# Patient Record
Sex: Female | Born: 1980 | Race: White | Hispanic: No | Marital: Married | Smoking: Never smoker
Health system: Southern US, Community
[De-identification: ages and names within clinical notes are randomized; demographics above are authoritative.]

## PROBLEM LIST (undated history)

## (undated) DIAGNOSIS — IMO0002 Reserved for concepts with insufficient information to code with codable children: Secondary | ICD-10-CM

## (undated) DIAGNOSIS — R87629 Unspecified abnormal cytological findings in specimens from vagina: Secondary | ICD-10-CM

## (undated) DIAGNOSIS — F329 Major depressive disorder, single episode, unspecified: Secondary | ICD-10-CM

## (undated) DIAGNOSIS — F32A Depression, unspecified: Secondary | ICD-10-CM

## (undated) DIAGNOSIS — O139 Gestational [pregnancy-induced] hypertension without significant proteinuria, unspecified trimester: Secondary | ICD-10-CM

## (undated) DIAGNOSIS — K589 Irritable bowel syndrome without diarrhea: Secondary | ICD-10-CM

## (undated) DIAGNOSIS — Z8669 Personal history of other diseases of the nervous system and sense organs: Secondary | ICD-10-CM

## (undated) DIAGNOSIS — I10 Essential (primary) hypertension: Secondary | ICD-10-CM

## (undated) DIAGNOSIS — R87619 Unspecified abnormal cytological findings in specimens from cervix uteri: Secondary | ICD-10-CM

## (undated) HISTORY — DX: Reserved for concepts with insufficient information to code with codable children: IMO0002

## (undated) HISTORY — DX: Gestational (pregnancy-induced) hypertension without significant proteinuria, unspecified trimester: O13.9

## (undated) HISTORY — PX: COLPOSCOPY: SHX161

## (undated) HISTORY — PX: WISDOM TOOTH EXTRACTION: SHX21

## (undated) HISTORY — DX: Unspecified abnormal cytological findings in specimens from cervix uteri: R87.619

## (undated) HISTORY — DX: Essential (primary) hypertension: I10

## (undated) HISTORY — DX: Unspecified abnormal cytological findings in specimens from vagina: R87.629

## (undated) HISTORY — DX: Personal history of other diseases of the nervous system and sense organs: Z86.69

## (undated) HISTORY — DX: Irritable bowel syndrome, unspecified: K58.9

## (undated) HISTORY — PX: CHOLECYSTECTOMY: SHX55

---
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
## 2001-09-11 ENCOUNTER — Encounter: Payer: Self-pay | Admitting: Emergency Medicine

## 2001-09-11 ENCOUNTER — Emergency Department (HOSPITAL_COMMUNITY): Admission: EM | Admit: 2001-09-11 | Discharge: 2001-09-11 | Payer: Self-pay | Admitting: Emergency Medicine

## 2001-09-12 ENCOUNTER — Ambulatory Visit (HOSPITAL_COMMUNITY): Admission: RE | Admit: 2001-09-12 | Discharge: 2001-09-12 | Payer: Self-pay | Admitting: Family Medicine

## 2001-09-12 ENCOUNTER — Encounter: Payer: Self-pay | Admitting: Family Medicine

## 2001-09-24 ENCOUNTER — Encounter: Payer: Self-pay | Admitting: Gastroenterology

## 2001-09-24 ENCOUNTER — Encounter: Admission: RE | Admit: 2001-09-24 | Discharge: 2001-09-24 | Payer: Self-pay | Admitting: Gastroenterology

## 2001-09-28 ENCOUNTER — Encounter: Payer: Self-pay | Admitting: Gastroenterology

## 2001-09-28 ENCOUNTER — Encounter: Admission: RE | Admit: 2001-09-28 | Discharge: 2001-09-28 | Payer: Self-pay | Admitting: Gastroenterology

## 2001-10-28 ENCOUNTER — Emergency Department (HOSPITAL_COMMUNITY): Admission: EM | Admit: 2001-10-28 | Discharge: 2001-10-29 | Payer: Self-pay | Admitting: Emergency Medicine

## 2001-10-28 ENCOUNTER — Encounter: Payer: Self-pay | Admitting: Emergency Medicine

## 2002-06-11 ENCOUNTER — Encounter: Admission: RE | Admit: 2002-06-11 | Discharge: 2002-08-06 | Payer: Self-pay | Admitting: Sports Medicine

## 2002-10-01 ENCOUNTER — Encounter: Payer: Self-pay | Admitting: Emergency Medicine

## 2002-10-01 ENCOUNTER — Emergency Department (HOSPITAL_COMMUNITY): Admission: EM | Admit: 2002-10-01 | Discharge: 2002-10-02 | Payer: Self-pay | Admitting: Emergency Medicine

## 2002-10-13 ENCOUNTER — Encounter: Admission: RE | Admit: 2002-10-13 | Discharge: 2002-10-13 | Payer: Self-pay | Admitting: Emergency Medicine

## 2002-10-13 ENCOUNTER — Encounter: Payer: Self-pay | Admitting: Emergency Medicine

## 2002-11-29 ENCOUNTER — Other Ambulatory Visit: Admission: RE | Admit: 2002-11-29 | Discharge: 2002-11-29 | Payer: Self-pay | Admitting: Obstetrics and Gynecology

## 2003-01-04 ENCOUNTER — Ambulatory Visit (HOSPITAL_COMMUNITY): Admission: RE | Admit: 2003-01-04 | Discharge: 2003-01-04 | Payer: Self-pay | Admitting: Orthopaedic Surgery

## 2003-08-06 HISTORY — PX: OTHER SURGICAL HISTORY: SHX169

## 2004-06-01 ENCOUNTER — Other Ambulatory Visit: Admission: RE | Admit: 2004-06-01 | Discharge: 2004-06-01 | Payer: Self-pay | Admitting: Obstetrics and Gynecology

## 2004-09-27 ENCOUNTER — Emergency Department (HOSPITAL_COMMUNITY): Admission: EM | Admit: 2004-09-27 | Discharge: 2004-09-27 | Payer: Self-pay | Admitting: Emergency Medicine

## 2004-10-01 ENCOUNTER — Ambulatory Visit (HOSPITAL_COMMUNITY): Admission: RE | Admit: 2004-10-01 | Discharge: 2004-10-01 | Payer: Self-pay | Admitting: Family Medicine

## 2004-10-14 ENCOUNTER — Emergency Department (HOSPITAL_COMMUNITY): Admission: EM | Admit: 2004-10-14 | Discharge: 2004-10-14 | Payer: Self-pay | Admitting: Emergency Medicine

## 2004-10-14 ENCOUNTER — Emergency Department (HOSPITAL_COMMUNITY): Admission: EM | Admit: 2004-10-14 | Discharge: 2004-10-14 | Payer: Self-pay | Admitting: Family Medicine

## 2004-10-14 ENCOUNTER — Emergency Department (HOSPITAL_COMMUNITY): Admission: EM | Admit: 2004-10-14 | Discharge: 2004-10-15 | Payer: Self-pay | Admitting: Emergency Medicine

## 2004-11-02 ENCOUNTER — Encounter: Admission: RE | Admit: 2004-11-02 | Discharge: 2004-11-02 | Payer: Self-pay | Admitting: Gastroenterology

## 2004-11-14 ENCOUNTER — Emergency Department (HOSPITAL_COMMUNITY): Admission: EM | Admit: 2004-11-14 | Discharge: 2004-11-15 | Payer: Self-pay | Admitting: Emergency Medicine

## 2004-11-17 ENCOUNTER — Emergency Department (HOSPITAL_COMMUNITY): Admission: EM | Admit: 2004-11-17 | Discharge: 2004-11-17 | Payer: Self-pay | Admitting: Emergency Medicine

## 2004-11-19 ENCOUNTER — Ambulatory Visit (HOSPITAL_COMMUNITY): Admission: RE | Admit: 2004-11-19 | Discharge: 2004-11-19 | Payer: Self-pay | Admitting: Gastroenterology

## 2004-11-19 ENCOUNTER — Encounter (INDEPENDENT_AMBULATORY_CARE_PROVIDER_SITE_OTHER): Payer: Self-pay | Admitting: *Deleted

## 2004-11-21 ENCOUNTER — Inpatient Hospital Stay (HOSPITAL_COMMUNITY): Admission: EM | Admit: 2004-11-21 | Discharge: 2004-11-28 | Payer: Self-pay | Admitting: Emergency Medicine

## 2004-11-29 ENCOUNTER — Inpatient Hospital Stay (HOSPITAL_COMMUNITY): Admission: AD | Admit: 2004-11-29 | Discharge: 2004-12-01 | Payer: Self-pay | Admitting: Gastroenterology

## 2004-12-07 ENCOUNTER — Ambulatory Visit (HOSPITAL_COMMUNITY): Admission: RE | Admit: 2004-12-07 | Discharge: 2004-12-08 | Payer: Self-pay | Admitting: Surgery

## 2004-12-07 ENCOUNTER — Encounter (INDEPENDENT_AMBULATORY_CARE_PROVIDER_SITE_OTHER): Payer: Self-pay | Admitting: *Deleted

## 2005-06-04 ENCOUNTER — Other Ambulatory Visit: Admission: RE | Admit: 2005-06-04 | Discharge: 2005-06-04 | Payer: Self-pay | Admitting: Obstetrics and Gynecology

## 2005-11-18 ENCOUNTER — Encounter: Admission: RE | Admit: 2005-11-18 | Discharge: 2005-11-18 | Payer: Self-pay | Admitting: Gastroenterology

## 2005-11-20 ENCOUNTER — Encounter: Admission: RE | Admit: 2005-11-20 | Discharge: 2005-11-20 | Payer: Self-pay | Admitting: Gastroenterology

## 2006-06-05 ENCOUNTER — Other Ambulatory Visit: Admission: RE | Admit: 2006-06-05 | Discharge: 2006-06-05 | Payer: Self-pay | Admitting: Obstetrics and Gynecology

## 2007-12-20 ENCOUNTER — Ambulatory Visit (HOSPITAL_COMMUNITY): Admission: RE | Admit: 2007-12-20 | Discharge: 2007-12-20 | Payer: Self-pay

## 2008-01-23 ENCOUNTER — Emergency Department (HOSPITAL_COMMUNITY): Admission: EM | Admit: 2008-01-23 | Discharge: 2008-01-23 | Payer: Self-pay | Admitting: Emergency Medicine

## 2008-02-04 ENCOUNTER — Emergency Department (HOSPITAL_COMMUNITY): Admission: EM | Admit: 2008-02-04 | Discharge: 2008-02-04 | Payer: Self-pay | Admitting: Emergency Medicine

## 2008-12-19 ENCOUNTER — Emergency Department (HOSPITAL_COMMUNITY): Admission: EM | Admit: 2008-12-19 | Discharge: 2008-12-20 | Payer: Self-pay | Admitting: Emergency Medicine

## 2009-03-19 ENCOUNTER — Emergency Department (HOSPITAL_COMMUNITY): Admission: EM | Admit: 2009-03-19 | Discharge: 2009-03-19 | Payer: Self-pay | Admitting: Emergency Medicine

## 2009-03-24 ENCOUNTER — Ambulatory Visit (HOSPITAL_COMMUNITY): Admission: RE | Admit: 2009-03-24 | Discharge: 2009-03-24 | Payer: Self-pay | Admitting: Urology

## 2010-11-10 LAB — URINE CULTURE
Colony Count: NO GROWTH
Culture: NO GROWTH

## 2010-11-10 LAB — DIFFERENTIAL
Basophils Absolute: 0.1 10*3/uL (ref 0.0–0.1)
Basophils Relative: 1 % (ref 0–1)
Eosinophils Absolute: 0.2 10*3/uL (ref 0.0–0.7)
Eosinophils Relative: 3 % (ref 0–5)
Lymphocytes Relative: 22 % (ref 12–46)
Lymphs Abs: 2.1 10*3/uL (ref 0.7–4.0)
Monocytes Absolute: 0.4 10*3/uL (ref 0.1–1.0)
Monocytes Relative: 4 % (ref 3–12)
Neutro Abs: 6.9 10*3/uL (ref 1.7–7.7)
Neutrophils Relative %: 71 % (ref 43–77)

## 2010-11-10 LAB — POCT I-STAT, CHEM 8
BUN: 9 mg/dL (ref 6–23)
Calcium, Ion: 1.15 mmol/L (ref 1.12–1.32)
Chloride: 107 mEq/L (ref 96–112)
Creatinine, Ser: 0.7 mg/dL (ref 0.4–1.2)
Glucose, Bld: 91 mg/dL (ref 70–99)
HCT: 42 % (ref 36.0–46.0)
Hemoglobin: 14.3 g/dL (ref 12.0–15.0)
Potassium: 3.6 mEq/L (ref 3.5–5.1)
Sodium: 143 mEq/L (ref 135–145)
TCO2: 22 mmol/L (ref 0–100)

## 2010-11-10 LAB — URINALYSIS, ROUTINE W REFLEX MICROSCOPIC
Bilirubin Urine: NEGATIVE
Glucose, UA: NEGATIVE mg/dL
Hgb urine dipstick: NEGATIVE
Ketones, ur: NEGATIVE mg/dL
Nitrite: NEGATIVE
Protein, ur: NEGATIVE mg/dL
Specific Gravity, Urine: 1.016 (ref 1.005–1.030)
Urobilinogen, UA: 0.2 mg/dL (ref 0.0–1.0)
pH: 5.5 (ref 5.0–8.0)

## 2010-11-10 LAB — CBC
HCT: 40.9 % (ref 36.0–46.0)
Hemoglobin: 14.2 g/dL (ref 12.0–15.0)
MCHC: 34.7 g/dL (ref 30.0–36.0)
MCV: 88 fL (ref 78.0–100.0)
Platelets: 197 10*3/uL (ref 150–400)
RBC: 4.65 MIL/uL (ref 3.87–5.11)
RDW: 11.9 % (ref 11.5–15.5)
WBC: 9.7 10*3/uL (ref 4.0–10.5)

## 2010-11-13 LAB — CBC
HCT: 39.1 % (ref 36.0–46.0)
Hemoglobin: 13.6 g/dL (ref 12.0–15.0)
MCHC: 34.7 g/dL (ref 30.0–36.0)
MCV: 88.8 fL (ref 78.0–100.0)
Platelets: 176 10*3/uL (ref 150–400)
RBC: 4.4 MIL/uL (ref 3.87–5.11)
RDW: 12.1 % (ref 11.5–15.5)
WBC: 10.8 10*3/uL — ABNORMAL HIGH (ref 4.0–10.5)

## 2010-11-13 LAB — WET PREP, GENITAL
Clue Cells Wet Prep HPF POC: NONE SEEN
Trich, Wet Prep: NONE SEEN
Yeast Wet Prep HPF POC: NONE SEEN

## 2010-11-13 LAB — URINE CULTURE: Colony Count: >=100000

## 2010-11-13 LAB — GC/CHLAMYDIA PROBE AMP, GENITAL
Chlamydia, DNA Probe: NEGATIVE
GC Probe Amp, Genital: NEGATIVE

## 2010-11-13 LAB — COMPREHENSIVE METABOLIC PANEL
ALT: 12 U/L (ref 0–35)
AST: 14 U/L (ref 0–37)
Albumin: 3.4 g/dL — ABNORMAL LOW (ref 3.5–5.2)
Alkaline Phosphatase: 50 U/L (ref 39–117)
BUN: 10 mg/dL (ref 6–23)
CO2: 23 mEq/L (ref 19–32)
Calcium: 8.8 mg/dL (ref 8.4–10.5)
Chloride: 106 mEq/L (ref 96–112)
Creatinine, Ser: 0.67 mg/dL (ref 0.4–1.2)
GFR calc Af Amer: >60 mL/min (ref >60)
GFR calc non Af Amer: >60 mL/min (ref >60)
Glucose, Bld: 93 mg/dL (ref 70–99)
Potassium: 3.7 mEq/L (ref 3.5–5.1)
Sodium: 137 mEq/L (ref 135–145)
Total Bilirubin: 0.4 mg/dL (ref 0.3–1.2)
Total Protein: 6.5 g/dL (ref 6.0–8.3)

## 2010-11-13 LAB — URINALYSIS, ROUTINE W REFLEX MICROSCOPIC
Glucose, UA: NEGATIVE mg/dL
Ketones, ur: 15 mg/dL — AB
Nitrite: NEGATIVE
Protein, ur: NEGATIVE mg/dL
Specific Gravity, Urine: 1.018 (ref 1.005–1.030)
Urobilinogen, UA: 1 mg/dL (ref 0.0–1.0)
pH: 7 (ref 5.0–8.0)

## 2010-11-13 LAB — URINE MICROSCOPIC-ADD ON

## 2010-11-13 LAB — DIFFERENTIAL
Basophils Absolute: 0.1 10*3/uL (ref 0.0–0.1)
Basophils Relative: 1 % (ref 0–1)
Eosinophils Absolute: 0.3 10*3/uL (ref 0.0–0.7)
Eosinophils Relative: 2 % (ref 0–5)
Lymphocytes Relative: 24 % (ref 12–46)
Lymphs Abs: 2.6 10*3/uL (ref 0.7–4.0)
Monocytes Absolute: 0.7 10*3/uL (ref 0.1–1.0)
Monocytes Relative: 6 % (ref 3–12)
Neutro Abs: 7.2 10*3/uL (ref 1.7–7.7)
Neutrophils Relative %: 67 % (ref 43–77)

## 2010-11-13 LAB — POCT PREGNANCY, URINE: Preg Test, Ur: NEGATIVE

## 2010-12-21 NOTE — H&P (Signed)
NAMETUWANA, KAPAUN NO.:  [id number]   MEDICAL RECORD NO.:  [id number]          PATIENT TYPE:  INP   LOCATION:  0376                         FACILITY:  [facility]   PHYSICIAN:  James L. Malon Kindle., M.D.DATE OF BIRTH:  25-Aug-1980   DATE OF ADMISSION:  11/30/2004  DATE OF DISCHARGE:                                HISTORY & PHYSICAL   REASON FOR ADMISSION:  Nausea, vomiting and abdominal pain.   HISTORY:  This is a readmission for this 30 year old white female who was  seen the first time in our practice on March 27. She had had a long history  of irritable bowel. She had been ill for 3-4 weeks, started having nausea,  vomiting, diarrhea and abdominal pain. She had a CT scan of the abdomen that  was negative, negative urine pregnancy, urinalysis, etc. She had multiple ER  visits. She has had gallbladder ultrasounds. When I saw her she was having  quite a bit of diarrhea, we did a colonoscopy with biopsy of her colon and  this was normal. The patient was quite agitated during the procedure and did  not tolerate it particularly well.  Multiple other tests have included CT  scans, ultrasounds, gallbladder ejection fraction with CCK stimulation  showing slight diminishment at 30 minutes of only 6% but 39% at an hour.  Negative chest x-ray. There was some question on the acute abdominal series  of a spot on the lung but this did not show up on PA and lateral chest. No  abnormalities were seen on ultrasound. She did have very mild delayed  gastric emptying with 51% at 60 minutes and 48% under 20 minutes. This was  diminished and it was felt to be due to the narcotics she was taking. She  was in [facility] and was sent home yesterday and was seen by my  partner Dr. Madilyn Fireman. Apparently the patient was eating and wanted to go home.  She had been seen in consultation by Dr. Ezzard Standing, seen Dr. Daphine Deutscher and Dr.  Zachery Dakins as well as Dr. Ewing Schlein over the weekend. Everyone has  told her that  all the tests performed on her including an outpatient upper GI and small  bowel followthrough have all been normal and that we still could not be  absolutely certain that this was not her gallbladder although the odds of  her getting better would be much improved with a positive test. She seemed  to accept this and was feeling a bit better yesterday and went home. She  apparently ate [place] food last night, became sick, started having upper  abdominal pain and vomiting every since. She came into the office  unannounced vomiting in the waiting room and exam room and we put her back  in a room and are seeing her urgently now.   CURRENT MEDICATIONS:  Lexapro, Xanax, Nexium, birth control pills,  multivitamins, Carafate, Phenergan, Reglan, Vicodin.   ALLERGIES:  She is allergic to SULFA and IVP DYE.   PAST MEDICAL HISTORY:  History of asthma in the distant past, has had  chronic irritable bowel syndrome, acne.   PAST  SURGICAL HISTORY:  Her only surgery has been removal of her wisdom  teeth under general anesthesia.   FAMILY HISTORY:  Mother is 55 and has endometriosis. Father has bipolar  disease, two siblings have ADHD. There is no family history of inflammatory  bowel disease or colon cancer although there might have been colon polyps in  a grandparent.   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  Drinks occasionally, does not really travel much. Her  main social concern at this point seems to be graduating on time.   REVIEW OF SYMPTOMS:  Primarily as mentioned above. She has lost 20 pounds  over the past several weeks due to nausea, vomiting and abdominal pain.   PHYSICAL EXAMINATION:  VITAL SIGNS:  The patient is afebrile, temperature  98.4, pulse 84, blood pressure 110/68.  GENERAL:  An obese, crying, white female who is retching into a trash can  clear vomitus.  HEENT:  Eyes sclera nonicteric, extraocular  movements intact. Throat, mucous  membranes are dry.  LUNGS:  Clear.  HEART:  Regular rate and rhythm without murmurs or gallops.  ABDOMEN:  Mild epigastric tenderness with a few bowel sounds.   ASSESSMENT:  Nausea and vomiting of unclear cause. I am not certain that  this is due to her gallbladder but at this point she has had an extensive  workup that has failed to show any other etiology. Clearly with her vomiting  we need to get this under control and have her reseen by the surgeons to see  if we need to reconsider cholecystectomy. I think that we need to repeat a  hepatobiliary scan as well. This has been the only test to date that has  been abnormal.      JLE/MEDQ  D:  11/29/2004  T:  11/29/2004  Job:  [id number]   cc:   Stacie Acres. White, M.D.  [address]  [place]  [place] [address]  Fax: [phone]   Sandria Bales. Ezzard Standing, M.D.  [address]  [place]  [place] [address]

## 2010-12-21 NOTE — Op Note (Signed)
[name] NO.:  [id number]   MEDICAL RECORD NO.:  [id number]          PATIENT TYPE:  OIB   LOCATION:  2550                         FACILITY:  [facility]   PHYSICIAN:  Sandria Bales. Ezzard Standing, M.D.  DATE OF BIRTH:  July 06, 1981   DATE OF PROCEDURE:  12/07/2004  DATE OF DISCHARGE:                                 OPERATIVE REPORT   PREOPERATIVE DIAGNOSIS:  Right upper quadrant pain, possible biliary pain.   POSTOPERATIVE DIAGNOSIS:  Right upper quadrant pain, possible biliary pain.   PROCEDURE:  Laparoscopic cholecystectomy with intraoperative cholangiogram.   SURGEON:  Sandria Bales. Ezzard Standing, M.D.   ASSISTANT:  No first assistant.   ANESTHESIA:  General.   BLOOD LOSS:  Minimal.   INDICATIONS FOR PROCEDURE:  [name] is a 30 year old, mildly obese,  white female who has had about an eight-week history of nausea, abdominal  pain and on-and-off loose stools.  She has seen Dr. Claria Dice and been  through multiple evaluations which include CT scans, ultrasounds,  hepatobiliary scans, each repeated at least once.  She had a colonoscopy  done and had a gastric emptying scan.  The gastric emptying scan showed some  delay in gastric emptying, but the other test have been basically negative.   She has continued to have vague epigastric pain which  radiates to the right  upper quadrant.  It sometimes seems to be associated with food.  It is  unclear whether this is gallbladder/biliary pain or some type of irritable  bowel syndrome.  We discussed with the patient about the options for further  treatment, which include further medical management of her symptoms, furthr  tests to evaluate her pain, versus laparoscopic surgery to evaluate her  peritoneal cavity and perform a cholecystectomy.  She was interested in  proceeding with gallbladder surgery.  At the same time, a laparoscopic  evaluation can be done.   The indications and potential complications were explained to the  patient  multiple times.  In fact, her original surgery was planned by Dr. Zachery Dakins.  The potential complications were explained and include, but are not limited  to, bleeding, infection, bowel injury, bile duct injury, the possibility of  open surgery, and possibility of finding some other cause for her pain at  the time of surgery.  She also understands there is at least a 50% chance  that the surgery will not help her symptoms.   The patient agreed to proceed with laparoscopic cholecystectomy and  laparoscopy for evaluation.   DESCRIPTION OF PROCEDURE:  The patient was placed in a supine position and  given a general endotracheal anesthetic.  She had 1 g of Ancef initiation of  the procedure.  Her abdomen was prepped with Betadine solution and sterilely  draped.   Four abdominal trocars were placed.  A 12-mm Hasson trocar introduced  through an Hasson method at the umbilicus, then a 10-mm subxiphoid trocar, 5-  mm right subcostal, 5-mm lateral subcostal trocar.   The 10-mm, zero-degree laparoscope was inserted into the abdominal cavity.  The right and left lobes of the liver were unremarkable.  The anterior wall  of the stomach was unremarkable.  The bowel, that which I could see, was  unremarkable.  I did pay particular attention to the right colon, especially  to see if there was an inflammatory mass, lesion or nodularity.  There was  nothing that I could see that was abnormal, specifically, there was no  abnormal fluid, no nodularity, no suspicious mass lesion or nodule or  anything to explain her abdominal pain.   The gallbladder was visualized.  It may be slightly whiter than normal but  did not have any evidence or stigmata of cholecystitis.   I identified the gallbladder, grasped the gall bladder and rotated the  gallbladder cephalad and cut down and identified the cystic artery which was  doubly endo-clipped and divided. The cystic duct was identified and a clip  was  placed on the gallbladder side of the cystic duct.   An intraoperative cholangiogram was obtained with the cut-off, taut catheter  inserted through a 14-gauge Jelco in the right upper quadrant, into the  cystic duct and this was secured with an endoclip.   The patient used about 4-5 mL of half-strength Renografin for contrast,  which showed free flow down the cystic duct, down the common bile duct, into  the duodenum and reflux up the hepatic radicles without any abnormality or  filling defect.  This was felt to be a normal intraoperative cholangiogram.   Note, that she did say she had been allergic to IV dye, had a rash about  five years ago, but we had no trouble at the time of surgery after the  administration of this contrast.   The gallbladder and cystic duct were then triply endoclipped and divided.  The gallbladder was then sharply and bluntly dissected from the gallbladder  wall using primary hook dissection.  The patient had one bleeder in the  gallbladder bed that required Bovie electrocautery.  I did leave the piece  of Surgicel in the gallbladder bed.   I then irrigated the abdomen out with about 1.5 liters of saline.  I  delivered the gallbladder through the umbilicus in an EndoCatch bag and sent  it to pathology.  I did not feel any obvious or palpable stone in the gall  bladderat the time of surgery.   Each trocar was removed in turn.  There was no bleeding at any trocar site  and umbilical port was closed with 0 Vicryl suture.  The skin at each site  was closed with a 5-0 Vicryl suture.  The patient was painted with Benzoin  and Steri-Strips.  The patient tolerated the procedure well and was  transported to recovery room in good condition.   Sponge and needle counts were correct at the end of the case.      DHN/MEDQ  D:  12/07/2004  T:  12/07/2004  Job:  [id number]   cc:   Stacie Acres. White, M.D. [address]  [place]  [place] [address]  Fax: [phone]    Llana Aliment. Malon Kindle., M.D.  [address]  [place]  [place] [address]  Fax: [phone]

## 2010-12-21 NOTE — H&P (Signed)
[name], DUNFORD NO.:  [id number]   MEDICAL RECORD NO.:  [id number]          PATIENT TYPE:  INP   LOCATION:  0104                         FACILITY:  [facility]   PHYSICIAN:  Jackie Plum, M.D.DATE OF BIRTH:  [birth year]   DATE OF ADMISSION:  11/21/2004  DATE OF DISCHARGE:                                HISTORY & PHYSICAL   CHIEF COMPLAINT:  Abdominal pain, nausea and vomiting.   HISTORY OF PRESENT ILLNESS:  Patient is a 30 year old Caucasian female with  a history of irritable bowel syndrome, who presented with the above  complaint.  According to the patient, she has been having abdominal pain for  about two months now and has had workup, mainly by Dr. Randa Evens of [facility].  Her last evaluation was on a Monday, November 19, 2004, at which time she had a  colonoscopy.  She does not know the result of the colonoscopy at this point.  She indicates that after the colonoscopy, she had severe pain, and Dr.  Randa Evens called in a medicine to her the next day, Tuesday, November 20, 2004,  yesterday, which relieved her pain.  She came to the ER because her pain  recurred.  She had visited the ER about six times because of this abdominal  pain over the last month or so.  The pain is mainly in the right upper  quadrant area and epigastric area as well as the lower extremity area.  The  pain is mainly in the right upper quadrant and epigastric area.  She has had  some pain involving the left lower quadrant and right upper quadrant  previously, but she does not have this pain right now.  The pain is  described as crampy in nature with moderate severity without any known  aggravating or alleviating factor except for pain meds.  She vomited twice  today.  She denies any constipation or diarrhea.  She denies any fevers or  chills, headache, cough, chest pain, shortness of breath.  In the emergency  room, the patient had some Dilaudid.  The patient came to the emergency room  today  because she called Dr. Randa Evens office and was asked to come to the  emergency room for evaluation and possible admission.  She was under the  impression that she would be admitted to the GI service.   PAST MEDICAL HISTORY:  History of recurrent abdominal pain.  Previous  studies of irritable bowel syndrome.  History of depression.   ALLERGIES:  Patient is allergic to IVP DYE, which causes rash, and MYCINS,  SULFA medications.   Her current medicines including Lexapro, Zyrtec, multivitamins, birth  control pills, and Nexium as well as Carafate.   SOCIAL HISTORY:  The patient does not drink alcohol nor use illicit drugs  but she smoke cigarettes.   PHYSICAL EXAMINATION:  VITAL SIGNS:  BP 111/62, pulse 70, respirations 20,  temp 98.3.  O2 saturation of 98% on room air.  GENERAL:  She was in moderate painful distress.  Not in acute  cardiopulmonary distress.  HEENT:  Normocephalic and atraumatic.  Pupils are equal, round and reactive  to  light.  Extraocular movements are intact.  NECK:  Supple.  No JVD.  LUNGS:  Clear to auscultation bilaterally.  ABDOMEN:  Full.  Soft.  She had tenderness involving the right upper  quadrant and epigastric area.  She also has some mild tenderness in the  lower abdominal region without any rebound tenderness.  CARDIAC:  Regular rate and rhythm.  No murmurs or gallops.  EXTREMITIES:  No cyanosis or edema.  NEUROLOGIC:  Nonfocal.   LAB WORK:  Abdominal ultrasound has been ordered, results pending.   UA:  Color was amber appearance.  Cloudy.  Specific gravity 1.027, pH 5.5,  glucose negative, hemoglobin negative, bilirubin small, ketones trace,  protein negative.  Urobilinogen 0.2, nitrite negative, leukocyte esterase  negative.  Urine pregnancy test was negative.  WBC 9.5, hemoglobin 14.3,  hematocrit 41.1, MCV 85.1, platelet count 195.  Sodium 138, potassium 3.7,  chloride 109, CO2 27, glucose 83, BUN 10, creatinine 0.8, calcium 9.8,  protein 7.2,  albumin 3.8, AST 24, ALT 21, alkaline phosphatase 41. Bilirubin  0.4.   IMPRESSION:  Acute-on-chronic recurrent abdominal pain.   PLAN:  Admit the patient to medical service for pain control.  We do not  have access to the patient's previous GI workup at this point.  GI medicine  has been consulted.  I called Dr Reece Agar on call for Dr. Randa Evens this  evening, and discussed the case with him.  I have asked him to see the  patient tonight in the ED.  We will follow up the ultrasound report and  follow further work up per GI Medicine.      GO/MEDQ  D:  11/21/2004  T:  11/21/2004  Job:  [id number]

## 2010-12-21 NOTE — Op Note (Signed)
[name], LOBB          ACCOUNT NO.:  [id number]   MEDICAL RECORD NO.:  [id number]          PATIENT TYPE:  AMB   LOCATION:  ENDO                         FACILITY:  [facility]   PHYSICIAN:  James L. Malon Kindle., M.D.DATE OF BIRTH:  1981-06-26   DATE OF PROCEDURE:  11/19/2004  DATE OF DISCHARGE:                                 OPERATIVE REPORT   PROCEDURE:  Colonoscopy and biopsy.   MEDICATIONS:  1.  Phenergan 25 mg.  2.  Fentanyl 100 mcg.  3.  Versed 10 mg IV.   SCOPE:  Olympus pediatric adjustable colonoscope.   INDICATION:  A 30 year old with a long history of irritable bowel, whom I  saw in the office with diarrhea, nausea, vomiting, and 20 pound weight loss.  Her stools were positive for blood.  She was having profuse diarrhea.  Ultrasound showed fatty liver.  She had been having up to 12 bowel movements  a day.  The concern was that she may have inflammatory bowel disease and for  this reason, colonoscopy was recommended.  She was seen in the emergency  room over the weekend.  Labs were normal.  This time with upper abdominal  pain.  We discussed with her at the unit and obtained permission to do an  upper endoscopy as well.   DESCRIPTION OF PROCEDURE:  The procedure explained to the patient, consent  obtained.  The left lateral decubitus position, digital exam was performed,  and the Olympus pediatric adjustable colonoscope was inserted.  The prep was  excellent.  We advanced the scope and despite an enormous amount of  medication, the patient was belligerent and violent throughout the procedure  with immediate sedation as soon as the scope advancement was stopped.  We  were eventually able to reach the cecum using no abdominal pressure, and we  were actually able to arrive fairly easily and as soon as the cecum was  reached, the cecum and ileocecal valve were photographed and the scope  withdrawn.  The patient promptly fell asleep and no longer had to be  restrained.  Multiple biopsies were taken throughout.  There was normal  mucosa throughout with no ulceration, no polyps, no tumors, etc., no  diverticular disease.  All the biopsies were placed in a single jar labeled  random colon, rule out colitis.  There were small, if any, internal  hemorrhoids in the rectum upon removal of the scope.  The scope was  withdrawn.  The patient tolerated the procedure reasonably well after the  scope had reached the cecum and was sleeping peacefully at the termination  of the procedure.   ASSESSMENT:  1.  Heme-positive stool.  72.1  2.  Diarrhea, cannot rule out microscopic colitis.  787.91.  3.  Mucosa normal on colonoscopy, grossly.   PLAN:  We will check path results.  We will arrange an upper GI and small  bowel series to rule out ulcer or Crohn's disease.  We will obtain records  from her emergency room visit and return to the office in 2-3 weeks.      JLE/MEDQ  D:  11/19/2004  T:  11/19/2004  Job:  [id number]   cc:   Stacie Acres. White, M.D.  [address]  [place]  [place] [address]  Fax: [phone]   Thornton Park. Daphine Deutscher, MD  [address]  [place]  [place] [address]

## 2010-12-21 NOTE — Discharge Summary (Signed)
[name], [name] NO.:  [id number]   MEDICAL RECORD NO.:  [id number]          PATIENT TYPE:  INP   LOCATION:  0351                         [facility]ACILITY:  [facility]   PHYSICIAN:  John C. Madilyn Fireman, M.D.    DATE OF BIRTH:  03-14-1981   DATE OF ADMISSION:  11/21/2004  DATE OF DISCHARGE:  11/28/2004                                 DISCHARGE SUMMARY   HISTORY OF PRESENT ILLNESS:  The patient is a 30 year old white female,  admitted with a 51-month history of right-sided abdominal pain, nausea,  vomiting and loose stools.  Prior to admission, she had undergone an  abdominal CT scan, upper GI with small bowel series, colonoscopy and  abdominal ultrasound.  These were all unrevealing except for suggestion of a  thickened gallbladder with follow-up hepatobiliary scan normal.  For  details, please see admission history and physical.   HOSPITAL COURSE:  The patient was started on IV fluids, and her pain and  nausea were treated symptomatically.  She had a 24-hour urine, porphyria  screen, serum gastrin and tissue transglutaminase, which were all negative.  Based on the previous ultrasound, the ultrasound was repeated and was  normal.  Nonetheless, Dr. Ezzard Standing was consulted for surgical standpoint  regarding possible gallbladder source of her pain.  He did not think that  there was any objective evidence tying her symptoms to her gallbladder.  She  did undergo a nuclear medicine gastric emptying study which showed some  delayed gastric emptying.  It was considered to start her on Reglan, but the  patient was taking a lot of Dilaudid, Phenergan and Zofran, which were  causing her to be quite sedated and lethargic, and I did not want to start  her on any other psychotropic medications until she had become weaned off of  some of these medicines.  The patient demonstrated some episodes of  dissatisfaction with the nursing staff which are documented in the chart.  Some of this was related  to difficulty with IV access.  The patient  continued to complain of pain throughout the majority of her  hospitalization.  A chest x-ray had initially suggested a possible right-  sided nodule, but a repeat x-ray suggested this has just been a confluence  of shadows.  On 04/25, the patient had a long discussion with Dr. Zachery Dakins  regarding cholecystectomy and uncertain benefit of this in light of absence  of objective evidence of gallbladder disease.  On 04/25, the patient had  tolerated breakfast, was nauseated but having no vomiting, and we decided to  let her be discharged to outpatient followup with Dr. Randa Evens and possibly  Dr. Zachery Dakins.   DISCHARGE MEDICATIONS:  Same as on admission plus Phenergan.   DISCHARGE DIAGNOSES:  Abdominal pain, nausea, vomiting of unclear etiology.   CONDITION ON DISCHARGE:  Improved.      JCH/MEDQ  D:  12/10/2004  T:  12/10/2004  Job:  [id number]   cc:   Anselm Pancoast. Zachery Dakins, M.D.  [address]  [place]  [place] [address]   Llana Aliment. Malon Kindle., M.D.  [address] [employer]  [address]  [place]  [place] [address]  Fax: [phone]

## 2010-12-21 NOTE — Consult Note (Signed)
[name], [name] NO.:  [id number]   MEDICAL RECORD NO.:  [id number]          PATIENT TYPE:  INP   LOCATION:  0351                         FACILITY:  [facility]   PHYSICIAN:  Sandria Bales. Ezzard Standing, M.D.  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  Approximately some time at the end of February or the beginning  of March, she started having periods of dizziness followed by nausea then  followed by vomiting and pain, all within about a 1 hour period.  She does  not relate her abdominal pain to eating any certain foods or any specific  activity.   She has presented to the [facility] Emergency Room on multiple occasions this  year; I think the first was maybe the 23rd of February and then the 12th of  March, then the 13th of April, and then she was actually admitted the 19th  of April.   While in the hospital, she continued to have right upper quadrant pain,  periodic nausea and vomiting.  She has also had some loose stools.  She has  been through a thorough evaluation which has included several CT scans, at  least 3 CT scans.  She has been through a hepatobiliary scan, at least 2  ultrasounds, and a gastric emptying scan and a colonoscopy.   Basically, all these tests are negative.  On her first ultrasound, there was  a question of gallbladder wall thickening, but the gallbladder at that time  was contracted.  A repeat ultrasound on the 19th of April, 2006, showed a  normal gallbladder wall.  Her hepatobiliary scan showed an ejection fraction  of approximately 38-39%.  [The normal gall bladder ejection fraction is  >30%.]  She has had no prior abdominal surgery.  She denies a history of  peptic ulcer disease, liver disease,  pancreatic disease, or colon disease.  Her sed rate has been 3.  Serum gastrin level was within normal limits.   Dr. Madilyn Fireman asked [place] to see her regarding evaluation for possible  cholecystectomy.   PAST MEDICAL HISTORY:  1.  She is allergic to SULFA which she says causes anaphylactic reaction.  2.  She is allergic to IVP DYE.  3.  ERYTHROMYCIN does not set well on her stomach.   MEDICATIONS:  1.  Lexapro 10 mg daily.  2.  Zyrtec 10 mg daily.  3.  Birth control pills daily.  4.  Nexium 40 mg daily.  5.  Carafate before meals.   REVIEW OF SYSTEMS:  NEUROLOGIC:  Again, she has had this dizziness which  occurs immediately before these periods of being sick.  She does have a  history of migraines.  PULMONARY:  She quit smoking as of August 05, 2004.  No history of  pnuemonia.  CARDIAC:  She has had no history of chest pain, angina,  or cardiac  evaluation.  GI:  See history of present illness.  UROLOGIC:  No history of kidney stones or kidney infections.  GYN:  She is on birth control pills, has never been pregnant.   PERSONAL HISTORY:  She is a Theatre stage manager, who is anticipating graduating  in 3-4 weeks, and her grandmother was actually in the room during part of my  exam and talking with her.   PHYSICAL EXAMINATION:  GENERAL:  She is a mildly obese white female.  VITAL SIGNS:  Her temperature is 98, pulse 93, blood pressure 118/74,  saturations 98%.  HEENT:  Unremarkable.  NECK:  Supple without mass or thyromegaly.  LUNGS:  Clear to auscultation.  HEART:  Regular rate and rhythm.  I hear no murmur or rub.  ABDOMEN:  She has some mild soreness maybe towards the right upper quadrant  more than any other part of her abdomen.  She has bowel sounds that are  present.  EXTREMITIES:  She has good strength in all 4 extremities.  She does complain  about her arms where she has had her IVs placed.   Her hemoglobin is 11.6, hematocrit 33.2, white blood count 6800.  Her sodium  is  138, potassium 3.2, chloride 106, CO2 25, glucose 90.  Her total protein is 5.3, albumin is 2.7, amylase is 39.   IMPRESSION:  1.  Abdominal pain which is nonspecific, certainly could match biliary      disease, though basically she has a negative work-up of her gallbladder      from a radiology standpoint.  I think this type of presentation is      problematic.  I talked to the patient at length about my concerns of her      symptoms not matching any xray test. I discussed that  we could proceed      with cholecystectomy as long as she understands well that the surgery      probably has at best a 50% chance of improving her symptoms.  There is      at least a 50% chance surgery will not help her symptoms at all.   There is significant risk to the surgery, including bleeding, infection,  bile duct injury, the possibility of open surgery, and probably most  important for her, the possibility of continued symptoms.   Again, she is a Theatre stage manager, expresses understanding of this discussion.   I explained to her that I am on call today, but I am not in the office or in  town tomorrow.  I have spoken with Dr. Thornton Dales, who is my partner,  who will see her and evaluate in the morning and decide whether going ahead  for surgery versus another test such as a hepatobiliary scan which at this  time is yet to be determined.   Her grandmother was in the room during this discussion and seemed to  understand all that I said.   1.  Delayed gastric emptying.  This is probably secondary to narcotics she      has been on.  I do not see any other reason for her to have a delayed      gastric emptying.  Again, her primary symptom is not nausea but  pain.   1.  Right upper lobe mass on her admission chest x-ray.  [Actually, the CXR      was part of an acute abdominal series.]  Plan to repeat chest x-ray  today.  If this mass is persistent, she will need a possible CT scan.     I have  discussed these findings with Dr. Madilyn Fireman.   Again, I will leave the decision for surgery up to Dr. Zachery Dakins for  disposition tomorrow.      DHN/MEDQ  D:  11/26/2004  T:  11/26/2004  Job:  [id number]   cc:   Everardo All. Madilyn Fireman, M.D.  [address], [address]  [place]  [place] [address]  Fax: [phone]   Llana Aliment. Malon Kindle., M.D.  [address], [address]  [place]  [place] [address]  Fax: [phone]   Jackie Plum, M.D.

## 2010-12-21 NOTE — H&P (Signed)
NAMECHONTE, RICKE NO.:  [id number]   MEDICAL RECORD NO.:  [id number]          PATIENT TYPE:  EMS   LOCATION:  ED                           FACILITY:  [facility]   PHYSICIAN:  Danise Edge, M.D.   DATE OF BIRTH:  May 27, 1981   DATE OF ADMISSION:  11/21/2004  DATE OF DISCHARGE:                                HISTORY & PHYSICAL   ADMISSION DIAGNOSIS:  Chronic recurrent abdominal pain x2 months.   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  [name] has chronic constipation, predominant  irritable bowel syndrome.  For approximately two months, she has had  episodes of right-sided abdominal cramps with nausea, severe vomiting, and  up to three loose bowel movements occurring approximately every third day.  Her episodes last a day in duration.  Then her bowel movements return to  normal (one formed bowel movement per day) without nausea or vomiting.  She  reports no gastrointestinal bleeding.  She has been on Lexapro for years.  She does have chronic, irregular menses on birth control pills.   She has undergone a CT scan of the abdomen and pelvis on September 28, 2001,  October 01, 2004, and November 17, 2004, all revealing normal anatomy except  for a 1.8 cm right ovarian cyst by her February, 2006 pelvic CT scan.   On November 02, 2004, upper GI small bowel follow-through x-ray series was  normal.   On November 17, 2004, abdominal ultrasound revealed a thickened gallbladder  suggesting acalculous cholecystitis.  Her hepatobiliary scan was normal.   On November 19, 2004, proctocolonoscopy of the cecum with random colonic biopsy  was normal.   MEDICATION ALLERGIES:  1.  SULFA.  2.  IVP DYE.   CHRONIC MEDICATIONS:  Lexapro, birth control pills, multivitamins.   PAST MEDICAL HISTORY:  Chronic constipation, predominant irritable bowel  syndrome.   HABITS:  [name] does not smoke cigarettes or consume  alcohol.   FAMILY HISTORY:  Noncontributory.   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   PHYSICAL EXAMINATION:  GENERAL APPEARANCE:  [name] is lying  comfortably on her stretcher in the emergency room, watching television.  Nonicteric sclerae.  Normal oropharynx.  LUNGS:  Clear to auscultation.  HEART:  Regular rate and rhythm without murmurs.  ABDOMEN:  Soft, flat, and nontender.  SKIN:  Warm and dry.   LABORATORY DATA:  CBC, complete metabolic profile, amylase, lipase, and  urinalysis are normal.   ASSESSMENT:  Chronic recurrent abdominal pain for two months.  Irregular  menses on birth control pills.   PLAN:  Intravenous saline and clear liquids.  Check acute abdominal x-ray  series.  Check tissue transglutaminase antibody.  Consider upper endoscopy  with small bowel biopsy.  Check fasting serum gastrin.  Check 24-hour urine  for alpha aminolevulinic acid and porphobilinogen.  Consider GYN  consultation for possible diagnostic laparoscopy to rule out endometriosis.  Consider psychiatric evaluation.  Consider making patient an appointment at  the [facility] functional bowel clinic.      MJ/MEDQ  D:  11/21/2004  T:  11/21/2004  Job:  [id number]

## 2011-05-02 LAB — POCT CARDIAC MARKERS
CKMB, poc: <1 — ABNORMAL LOW
Myoglobin, poc: 21.3
Operator id: 229371
Troponin i, poc: <0.05

## 2011-05-02 LAB — PROTIME-INR
INR: 0.9
Prothrombin Time: 12.3

## 2011-05-02 LAB — DIFFERENTIAL
Basophils Absolute: 0.1
Basophils Relative: 1
Eosinophils Absolute: 0.3
Eosinophils Relative: 3
Lymphocytes Relative: 21
Lymphs Abs: 2.6
Monocytes Absolute: 0.5
Monocytes Relative: 4
Neutro Abs: 9.1 — ABNORMAL HIGH
Neutrophils Relative %: 72

## 2011-05-02 LAB — D-DIMER, QUANTITATIVE: D-Dimer, Quant: 0.31

## 2011-05-02 LAB — CBC
HCT: 40.6
Hemoglobin: 14.1
MCHC: 34.7
MCV: 86.2
Platelets: 195
RBC: 4.71
RDW: 12.2
WBC: 12.7 — ABNORMAL HIGH

## 2011-05-02 LAB — POCT PREGNANCY, URINE
Operator id: 203781
Preg Test, Ur: NEGATIVE

## 2011-05-02 LAB — POCT I-STAT, CHEM 8
BUN: 20
Calcium, Ion: 1.18
Chloride: 106
Creatinine, Ser: 0.9
Glucose, Bld: 116 — ABNORMAL HIGH
HCT: 42
Hemoglobin: 14.3
Potassium: 3.7
Sodium: 138
TCO2: 20

## 2011-05-02 LAB — APTT: aPTT: 24

## 2011-08-16 ENCOUNTER — Encounter (HOSPITAL_COMMUNITY): Payer: Self-pay | Admitting: *Deleted

## 2011-08-16 ENCOUNTER — Emergency Department (HOSPITAL_COMMUNITY)
Admission: EM | Admit: 2011-08-16 | Discharge: 2011-08-16 | Disposition: A | Discharge status: Home / Self Care | Payer: Private Health Insurance - Indemnity | Attending: Emergency Medicine | Admitting: Emergency Medicine

## 2011-08-16 ENCOUNTER — Ambulatory Visit (HOSPITAL_COMMUNITY)
Admission: RE | Admit: 2011-08-16 | Discharge: 2011-08-16 | Disposition: A | Discharge status: Home / Self Care | Payer: Private Health Insurance - Indemnity | Source: Ambulatory Visit | Attending: Internal Medicine | Admitting: Internal Medicine

## 2011-08-16 ENCOUNTER — Other Ambulatory Visit (HOSPITAL_COMMUNITY): Payer: Self-pay | Admitting: Internal Medicine

## 2011-08-16 DIAGNOSIS — S0990XA Unspecified injury of head, initial encounter: Secondary | ICD-10-CM

## 2011-08-16 DIAGNOSIS — R51 Headache: Secondary | ICD-10-CM

## 2011-08-16 DIAGNOSIS — R42 Dizziness and giddiness: Secondary | ICD-10-CM | POA: Insufficient documentation

## 2011-08-16 DIAGNOSIS — S060X9A Concussion with loss of consciousness of unspecified duration, initial encounter: Secondary | ICD-10-CM

## 2011-08-16 DIAGNOSIS — S0083XA Contusion of other part of head, initial encounter: Secondary | ICD-10-CM | POA: Insufficient documentation

## 2011-08-16 DIAGNOSIS — S060X0A Concussion without loss of consciousness, initial encounter: Secondary | ICD-10-CM | POA: Insufficient documentation

## 2011-08-16 DIAGNOSIS — S0003XA Contusion of scalp, initial encounter: Secondary | ICD-10-CM | POA: Insufficient documentation

## 2011-08-16 DIAGNOSIS — S060XAA Concussion with loss of consciousness status unknown, initial encounter: Secondary | ICD-10-CM

## 2011-08-16 DIAGNOSIS — R110 Nausea: Secondary | ICD-10-CM | POA: Insufficient documentation

## 2011-08-16 DIAGNOSIS — J45909 Unspecified asthma, uncomplicated: Secondary | ICD-10-CM | POA: Insufficient documentation

## 2011-08-16 DIAGNOSIS — IMO0002 Reserved for concepts with insufficient information to code with codable children: Secondary | ICD-10-CM | POA: Insufficient documentation

## 2011-08-16 DIAGNOSIS — J320 Chronic maxillary sinusitis: Secondary | ICD-10-CM | POA: Insufficient documentation

## 2011-08-16 MED ORDER — ONDANSETRON 4 MG PO TBDP
4.0000 mg | ORAL_TABLET | Freq: Once | ORAL | Status: AC
  Administered 2011-08-16: 4 mg via ORAL
  Filled 2011-08-16: qty 1

## 2011-08-16 NOTE — ED Provider Notes (Signed)
History     CSN: [id number]  Arrival date & time 08/16/11  1215   First MD Initiated Contact with Patient 08/16/11 1237      Chief Complaint  Patient presents with  . Headache   patient states she accidentally struck the back of her head on an air conditioning unit, Wednesday, 2 days ago. There was no loss of consciousness at that time. She's had a headache since then.  She has had mild dizziness, mild nausea. To me. She appears to be awake, alert, oriented, and in no acute distress at all. She's RBC her primary care provider [facility] referred her for an MRI. This afternoon. She also has been referred to a neurologist, likely for a mild concussion. The patient was concerned and wanted to obtain a CAT scan of her head today. Told her that we could perform the CAT scan today or she could just wait for MRI but she appears to be somewhat anxious so will obtain the CAT scan of her brain today. As documented below. Her neurologic exam is completely normal  (Consider location/radiation/quality/duration/timing/severity/associated sxs/prior treatment) HPI  Past Medical History  Diagnosis Date  . Asthma     History reviewed. No pertinent past surgical history.  No family history on file.  History  Substance Use Topics  . Smoking status: Not on file  . Smokeless tobacco: Not on file  . Alcohol Use:     OB History    Grav Para Term Preterm Abortions TAB SAB Ect Mult Living                  Review of Systems  All other systems reviewed and are negative.    Allergies  Iohexol  Home Medications   Current Outpatient Rx  Name Route Sig Dispense Refill  . ACETAMINOPHEN 500 MG PO TABS Oral Take 1,000 mg by mouth every 6 (six) hours as needed. For headache    . DROSPIRENONE-ETHINYL ESTRADIOL 3-0.02 MG PO TABS Oral Take 1 tablet by mouth daily.      BP 143/93  Pulse 90  Temp(Src) 98 F (36.7 C) (Oral)  Resp 18  SpO2 100%  Physical Exam  Nursing note and vitals  reviewed. Constitutional: She is oriented to person, place, and time. She appears well-developed and well-nourished.  HENT:  Head: Normocephalic and atraumatic.       Scalp was examined. There is no redness, ecchymoses. No step-off. No deformity appreciated. TMs are clear and equal bilaterally.  Eyes: Conjunctivae and EOM are normal. Pupils are equal, round, and reactive to light.  Neck: Neck supple.  Cardiovascular: Normal rate and regular rhythm.  Exam reveals no gallop and no friction rub.   No murmur heard. Pulmonary/Chest: Breath sounds normal. She has no wheezes. She has no rales. She exhibits no tenderness.  Abdominal: Soft. Bowel sounds are normal. She exhibits no distension. There is no tenderness. There is no rebound and no guarding.  Musculoskeletal: Normal range of motion.  Neurological: She is alert and oriented to person, place, and time. She has normal reflexes. She displays normal reflexes. No cranial nerve deficit. She exhibits normal muscle tone. Coordination normal.       Patient is awake, alert, oriented. Cranial nerves normal as tested. Grip strength is equal. Coordination is normal. Internal questions appropriately.  Skin: Skin is warm and dry. No rash noted.  Psychiatric: She has a normal mood and affect.    ED Course  Procedures (including critical care time)  Labs Reviewed -  No data to display No results found.   No diagnosis found.    MDM  Pt is seen and examined;  Initial history and physical completed.  Will follow.        1:37 PM  Remained stable. At this time. Patient states she actually wants to be discharged because she now wants to have her MRI done.(MRI previously scheduled by PMD)  This is reasonable. She is in no acute distress. Will give her instructions for concussion and will try to have the patient care coordinator sister in directing pt towards the MRI suite.  MRI is abnormal and should, it we'll send her back to the ED, however, based  on clinical findings. I suspect will be normal.  Kiyah Demartini A. Patrica Duel, MD 08/16/11 1338

## 2011-08-16 NOTE — ED Notes (Signed)
To ed for eval of pain in back of head since hitting head on AC when standing up on wednesday. No LOC. HA since. States she feels like she isn't acting right. Pt with no neuro def noted. Seen by pmd today and has MRI ordered for this afternoon. No nausea or vomiting

## 2011-08-16 NOTE — ED Notes (Signed)
States hit head on wed. Denies loc. States headache since the incident and irritability with nausea. Denies nausea and vomiting.

## 2011-08-16 NOTE — ED Notes (Signed)
Pt late for mri,dc papers given without signature.

## 2012-07-09 ENCOUNTER — Emergency Department (HOSPITAL_COMMUNITY)
Admission: EM | Admit: 2012-07-09 | Discharge: 2012-07-09 | Disposition: A | Discharge status: Home / Self Care | Payer: Private Health Insurance - Indemnity | Attending: Emergency Medicine | Admitting: Emergency Medicine

## 2012-07-09 DIAGNOSIS — R109 Unspecified abdominal pain: Secondary | ICD-10-CM | POA: Insufficient documentation

## 2012-07-09 DIAGNOSIS — E860 Dehydration: Secondary | ICD-10-CM | POA: Insufficient documentation

## 2012-07-09 DIAGNOSIS — K5289 Other specified noninfective gastroenteritis and colitis: Secondary | ICD-10-CM | POA: Insufficient documentation

## 2012-07-09 DIAGNOSIS — K529 Noninfective gastroenteritis and colitis, unspecified: Secondary | ICD-10-CM

## 2012-07-09 DIAGNOSIS — J45909 Unspecified asthma, uncomplicated: Secondary | ICD-10-CM | POA: Insufficient documentation

## 2012-07-09 DIAGNOSIS — Z79899 Other long term (current) drug therapy: Secondary | ICD-10-CM | POA: Insufficient documentation

## 2012-07-09 LAB — COMPREHENSIVE METABOLIC PANEL
ALT: 23 U/L (ref 0–35)
AST: 22 U/L (ref 0–37)
Albumin: 4 g/dL (ref 3.5–5.2)
Alkaline Phosphatase: 64 U/L (ref 39–117)
BUN: 14 mg/dL (ref 6–23)
CO2: 21 mEq/L (ref 19–32)
Calcium: 9.9 mg/dL (ref 8.4–10.5)
Chloride: 104 mEq/L (ref 96–112)
Creatinine, Ser: 0.66 mg/dL (ref 0.50–1.10)
GFR calc Af Amer: >90 mL/min (ref >90)
GFR calc non Af Amer: >90 mL/min (ref >90)
Glucose, Bld: 114 mg/dL — ABNORMAL HIGH (ref 70–99)
Potassium: 3.8 mEq/L (ref 3.5–5.1)
Sodium: 138 mEq/L (ref 135–145)
Total Bilirubin: 0.4 mg/dL (ref 0.3–1.2)
Total Protein: 7.8 g/dL (ref 6.0–8.3)

## 2012-07-09 LAB — URINALYSIS, ROUTINE W REFLEX MICROSCOPIC
Bilirubin Urine: NEGATIVE
Glucose, UA: NEGATIVE mg/dL
Hgb urine dipstick: NEGATIVE
Ketones, ur: 15 mg/dL — AB
Nitrite: NEGATIVE
Protein, ur: NEGATIVE mg/dL
Specific Gravity, Urine: 1.028 (ref 1.005–1.030)
Urobilinogen, UA: 0.2 mg/dL (ref 0.0–1.0)
pH: 5.5 (ref 5.0–8.0)

## 2012-07-09 LAB — CBC
HCT: 44.2 % (ref 36.0–46.0)
Hemoglobin: 15.6 g/dL — ABNORMAL HIGH (ref 12.0–15.0)
MCH: 29.9 pg (ref 26.0–34.0)
MCHC: 35.3 g/dL (ref 30.0–36.0)
MCV: 84.7 fL (ref 78.0–100.0)
Platelets: 214 10*3/uL (ref 150–400)
RBC: 5.22 MIL/uL — ABNORMAL HIGH (ref 3.87–5.11)
RDW: 12.1 % (ref 11.5–15.5)
WBC: 18.9 10*3/uL — ABNORMAL HIGH (ref 4.0–10.5)

## 2012-07-09 LAB — URINE MICROSCOPIC-ADD ON

## 2012-07-09 LAB — LIPASE, BLOOD: Lipase: 26 U/L (ref 11–59)

## 2012-07-09 LAB — POCT PREGNANCY, URINE: Preg Test, Ur: NEGATIVE

## 2012-07-09 MED ORDER — ONDANSETRON 4 MG PO TBDP: 4.0000 mg | ORAL_TABLET | Freq: Three times a day (TID) | ORAL | Status: DC | PRN

## 2012-07-09 MED ORDER — LOPERAMIDE HCL 2 MG PO CAPS: 2.0000 mg | ORAL_CAPSULE | Freq: Four times a day (QID) | ORAL | Status: DC | PRN

## 2012-07-09 MED ORDER — ONDANSETRON HCL 4 MG/2ML IJ SOLN
4.0000 mg | INTRAMUSCULAR | Status: AC
  Administered 2012-07-09: 4 mg via INTRAVENOUS
  Filled 2012-07-09: qty 2

## 2012-07-09 MED ORDER — METOCLOPRAMIDE HCL 5 MG/ML IJ SOLN
10.0000 mg | Freq: Once | INTRAMUSCULAR | Status: AC
  Administered 2012-07-09: 10 mg via INTRAVENOUS
  Filled 2012-07-09: qty 2

## 2012-07-09 MED ORDER — SODIUM CHLORIDE 0.9 % IV BOLUS (SEPSIS)
1750.0000 mL | Freq: Once | INTRAVENOUS | Status: AC
  Administered 2012-07-09: 1750 mL via INTRAVENOUS

## 2012-07-09 MED ORDER — PROMETHAZINE HCL 25 MG RE SUPP: 25.0000 mg | Freq: Four times a day (QID) | RECTAL | Status: DC | PRN

## 2012-07-09 MED ORDER — PROMETHAZINE HCL 25 MG PO TABS: 25.0000 mg | ORAL_TABLET | Freq: Four times a day (QID) | ORAL | Status: DC | PRN

## 2012-07-09 NOTE — ED Notes (Signed)
abd pain began this am, with n/v not feeling well, body aches all over, denies any urinary sx. Has not had a flu shot.

## 2012-07-09 NOTE — ED Provider Notes (Signed)
History     CSN: [id number]  Arrival date & time 07/09/12  6213   First MD Initiated Contact with Patient 07/09/12 [phone]      Chief Complaint  Patient presents with  . Abdominal Pain  . Nausea  . Emesis    (Consider location/radiation/quality/duration/timing/severity/associated sxs/prior treatment) HPI Comments: [name] presents ambulatory for evaluation of nausea, vomiting, and diarrhea.  She awoke in the middle of the night feeling extremely nauseated .  She vomited several times.  She describes the emesis as non bloody and non bilious.  Shortly thereafter she developed loose stools that progressed to watery stools.  She has been alternating vomiting and diarrhea since.  She denies melena or hematochezia.  She denies URI-like symptoms, fever, but states she is having some cramping abdominal discomfort and mild myalgias.  Patient is a 31 y.o. female presenting with vomiting. The history is provided by the patient. No language interpreter was used.  Emesis  This is a new problem. The current episode started 6 to 12 hours ago. The problem occurs 5 to 10 times per day. The problem has not changed since onset.There has been no fever. Associated symptoms include abdominal pain and diarrhea. Pertinent negatives include no arthralgias, no chills, no cough, no fever, no headaches, no myalgias and no URI.    Past Medical History  Diagnosis Date  . Asthma     No past surgical history on file.  No family history on file.  History  Substance Use Topics  . Smoking status: Not on file  . Smokeless tobacco: Not on file  . Alcohol Use:     OB History    Grav Para Term Preterm Abortions TAB SAB Ect Mult Living                  Review of Systems  Constitutional: Negative for fever and chills.  Respiratory: Negative for cough.   Gastrointestinal: Positive for vomiting, abdominal pain and diarrhea.  Musculoskeletal: Negative for myalgias and arthralgias.  Neurological: Negative  for headaches.  All other systems reviewed and are negative.    Allergies  Effexor; Elavil; Erythromycin; Iohexol; Latex; and Sulfa antibiotics  Home Medications   Current Outpatient Rx  Name  Route  Sig  Dispense  Refill  . ACETAMINOPHEN 500 MG PO TABS   Oral   Take 1,000 mg by mouth every 6 (six) hours as needed. For headache         . ALPRAZOLAM 0.25 MG PO TABS   Oral   Take 0.25 mg by mouth at bedtime as needed. Anxiety         . AMLODIPINE BESYLATE 5 MG PO TABS   Oral   Take 5 mg by mouth daily.         Marland Kitchen CITALOPRAM HYDROBROMIDE 20 MG PO TABS   Oral   Take 20 mg by mouth daily.         . DROSPIRENONE-ETHINYL ESTRADIOL 3-0.02 MG PO TABS   Oral   Take 1 tablet by mouth daily.         Marland Kitchen MELATONIN 3 MG PO TABS   Oral   Take 1 tablet by mouth as needed. sleep         . PRESCRIPTION MEDICATION   Topical   Apply 1 application topically daily. Compound prescription-Phenergan cream 25 mg/ml           BP 145/101  Pulse 95  Temp 97.8 F (36.6 C) (Oral)  Resp 18  SpO2 98%  LMP 06/23/2012  Physical Exam  Nursing note and vitals reviewed. Constitutional: She is oriented to person, place, and time. She appears well-developed and well-nourished.  HENT:  Head: Normocephalic and atraumatic.  Right Ear: External ear normal.  Left Ear: External ear normal.  Nose: Nose normal.  Mouth/Throat: Oropharynx is clear and moist. No oropharyngeal exudate.  Eyes: Conjunctivae normal are normal. Pupils are equal, round, and reactive to light. Right eye exhibits no discharge. Left eye exhibits no discharge. No scleral icterus.  Neck: Normal range of motion. Neck supple. No JVD present. No tracheal deviation present.  Cardiovascular: Regular rhythm, S1 normal, S2 normal, normal heart sounds, intact distal pulses and normal pulses.   No extrasystoles are present. Tachycardia present.  PMI is not displaced.  Exam reveals no gallop and no decreased pulses.   No murmur  heard. Pulmonary/Chest: Effort normal and breath sounds normal. No stridor. No respiratory distress. She has no wheezes. She has no rales. She exhibits no tenderness.  Abdominal: Soft. Bowel sounds are normal. She exhibits no distension and no mass. There is tenderness (very vague diffuse discomfort). There is no rebound and no guarding.  Musculoskeletal: Normal range of motion. She exhibits no edema and no tenderness.  Lymphadenopathy:    She has no cervical adenopathy.  Neurological: She is alert and oriented to person, place, and time.  Skin: Skin is warm and dry. No rash noted. She is not diaphoretic. No erythema. No pallor.  Psychiatric: She has a normal mood and affect. Her behavior is normal.    ED Course  Procedures (including critical care time)   Labs Reviewed  URINALYSIS, ROUTINE W REFLEX MICROSCOPIC   No results found.   No diagnosis found.    MDM  Pt presents for evaluation of NVD and associated cramping abd discomfort.  She currently appears comfortable, note elevated HR and BP, NAD.  Abdomen is soft without evidence of peritonitis.  Will obtain basic labs, administer antiemetics prn, and IVF.  Will reassess.  At this time the history and exam are most concerning for an acute gastroenteritis.   1225.  Pt stable, NAD.  Vomiting has resolved and she has been able to tolerate some po clears.  Diarrhea persists.  She has a leukocytosis but is afebrile and appears nontoxic.  Plan symptomatic care with prn zofran and phenergan.  Encouraged outpt follow-up.  If she has continued diarrhea and remains afebrile over the next 48 hours, she has been instructed to start taking imodium to help stop the diarrhea.     Tobin Chad, MD 07/09/12 [phone]

## 2012-07-10 LAB — URINE CULTURE: Colony Count: 30000

## 2012-08-07 ENCOUNTER — Ambulatory Visit: Payer: Self-pay | Admitting: Obstetrics and Gynecology

## 2012-08-11 ENCOUNTER — Other Ambulatory Visit: Payer: Self-pay | Admitting: Obstetrics and Gynecology

## 2012-08-11 ENCOUNTER — Ambulatory Visit (INDEPENDENT_AMBULATORY_CARE_PROVIDER_SITE_OTHER): Payer: Managed Care, Other (non HMO)

## 2012-08-11 ENCOUNTER — Encounter: Payer: Self-pay | Admitting: Obstetrics and Gynecology

## 2012-08-11 ENCOUNTER — Ambulatory Visit (INDEPENDENT_AMBULATORY_CARE_PROVIDER_SITE_OTHER): Payer: Managed Care, Other (non HMO) | Admitting: Obstetrics and Gynecology

## 2012-08-11 ENCOUNTER — Telehealth: Payer: Self-pay | Admitting: Obstetrics and Gynecology

## 2012-08-11 VITALS — BP 104/76 | Resp 16 | Ht 66.5 in | Wt 203.0 lb

## 2012-08-11 DIAGNOSIS — O26849 Uterine size-date discrepancy, unspecified trimester: Secondary | ICD-10-CM

## 2012-08-11 DIAGNOSIS — O039 Complete or unspecified spontaneous abortion without complication: Secondary | ICD-10-CM

## 2012-08-11 LAB — HCG, QUANTITATIVE, PREGNANCY: hCG, Beta Chain, Quant, S: <2 m[IU]/mL

## 2012-08-11 NOTE — Progress Notes (Signed)
Had bleeding, cramping and passing clots on Sat.  Unplanned pregnancy with +UPT around Xmas.    Filed Vitals:   08/11/12 1502  BP: 104/76  Resp: 16   ROS: noncontributory  Pelvic exam:  VULVA: normal appearing vulva with no masses, tenderness or lesions,  VAGINA: normal appearing vagina with normal color and discharge, no lesions, brown d/c CERVIX: normal appearing cervix without discharge or lesions,  UTERUS: uterus is normal size, shape, consistency and nontender,  ADNEXA: normal adnexa in size, nontender and no masses.  U/S - no IUP, probable PCOs, , no signs of retained POCs  A/P Likely complete ab but rec repeat quant on fri when pt comes for AEX Check rh today, she thinks she is rh +.  If neg, will give rhogam although may be outside of window of true benefit. Rec follow quants to neg

## 2012-08-11 NOTE — Telephone Encounter (Signed)
Pt called, states LMP 06/15/12, had positive UPT on 12/24 and 25, had some abdominal cramping after initially finding out of pregnancy.  Pt states Saturday was bleeding and was passing clots that was happening off/on.  Pt also having abdominal cramping with the pain more pronounced on the right side.  Per AR, ok to schedule [place] and f/u appt with her today @ 1400 and f/u @ 1415, pt voices agreement.

## 2012-08-12 LAB — RH TYPE: Rh Type: NEGATIVE

## 2012-08-12 LAB — ABO

## 2012-08-13 ENCOUNTER — Telehealth: Payer: Self-pay

## 2012-08-13 NOTE — Telephone Encounter (Signed)
LM for pt re: test results. Per AR she does not need the Rhogam, even though she is RHneg, as she has passed the window of time where it would benefit her. I asked her to cb if she has any ?'s or if she would like BC. Melody Comas A

## 2012-08-14 ENCOUNTER — Ambulatory Visit: Payer: Self-pay | Admitting: Obstetrics and Gynecology

## 2012-08-18 LAB — US OB TRANSVAGINAL

## 2012-08-18 LAB — US OB COMP LESS 14 WKS

## 2012-08-25 ENCOUNTER — Ambulatory Visit: Payer: Managed Care, Other (non HMO) | Admitting: Obstetrics and Gynecology

## 2012-08-25 ENCOUNTER — Encounter: Payer: Self-pay | Admitting: Obstetrics and Gynecology

## 2012-08-25 VITALS — BP 128/80 | Temp 98.6°F | Ht 67.0 in | Wt 205.0 lb

## 2012-08-25 DIAGNOSIS — N898 Other specified noninflammatory disorders of vagina: Secondary | ICD-10-CM

## 2012-08-25 DIAGNOSIS — R8281 Pyuria: Secondary | ICD-10-CM

## 2012-08-25 DIAGNOSIS — Z01419 Encounter for gynecological examination (general) (routine) without abnormal findings: Secondary | ICD-10-CM

## 2012-08-25 DIAGNOSIS — Z124 Encounter for screening for malignant neoplasm of cervix: Secondary | ICD-10-CM

## 2012-08-25 DIAGNOSIS — N912 Amenorrhea, unspecified: Secondary | ICD-10-CM

## 2012-08-25 DIAGNOSIS — R102 Pelvic and perineal pain: Secondary | ICD-10-CM

## 2012-08-25 LAB — POCT WET PREP (WET MOUNT)
Trichomonas Wet Prep HPF POC: NEGATIVE
Whiff Test: NEGATIVE
pH: 4.5

## 2012-08-25 LAB — POCT URINALYSIS DIPSTICK
Bilirubin, UA: NEGATIVE
Glucose, UA: NEGATIVE
Nitrite, UA: NEGATIVE
Protein, UA: NEGATIVE
Spec Grav, UA: 1.02
Urobilinogen, UA: NEGATIVE
pH, UA: 5

## 2012-08-25 NOTE — Patient Instructions (Signed)
Avoid: - excess soap on genital area (consider using plain oatmeal soap) - use of powder or sprays in genital area - douching - wearing underwear to bed (except with menses) - using more than is directed detergent when washing clothes - tight fitting garments around genital area - excess sugar intake   

## 2012-08-25 NOTE — Progress Notes (Signed)
Subjective:    [name] is a 32 y.o. female, G0P0, who presents for an annual exam. The patient reports a sensation as if something is pushing on her bladder with a sensation as if she has to urinate when she reclines but then will not go when she goes to the bathroom.  Symptoms present x 3 weeks.Started on the right side as pain and now is more pressure in lower abdomen.  Denies any urgency, dysuria, nocturia, vaginal symptoms or bowel symptoms.  Does admit to vaginal odor..  Menstrual cycle:   LMP: Patient's last menstrual period was 06/15/2012.  Flow x 5 days usually with pad change every 4-6 hours; usually no cramps.   [h/o + UPT in December but bled x 2 days in January so was told she had miscarried]  negative QHCG            Review of Systems Pertinent items are noted in HPI. Denies pelvic pain, urinary tract symptoms, vaginitis symptoms, irregular bleeding, menopausal symptoms, change in bowel habits or rectal bleeding   Objective:    BP 128/80  Temp 98.6 F (37 C) (Oral)  Ht 5\' 7"  (1.702 m)  Wt 205 lb (92.987 kg)  BMI 32.11 kg/m2  LMP 06/15/2012    Wt Readings from Last 1 Encounters:  08/25/12 205 lb (92.987 kg)   Body mass index is 32.11 kg/(m^2). General Appearance: Alert, no acute distress HEENT: Grossly normal Neck / Thyroid: Supple, no thyromegaly or cervical adenopathy Lungs: Clear to auscultation bilaterally Back: No CVA tenderness Breast Exam: No masses or nodes.No dimpling, nipple retraction or discharge. Cardiovascular: Regular rate and rhythm.  Gastrointestinal: Soft, non-tender, no masses or organomegaly Pelvic Exam: EGBUS-wnl, vagina-normal rugae, cervix- without lesions or tenderness, uterus appears normal size shape and consistency, adnexae-no masses or tenderness Lymphatic Exam: Non-palpable nodes in neck, clavicular,  axillary, or inguinal regions  Skin: no rashes or abnormalities Extremities: no clubbing cyanosis or edema  Neurologic: grossly  normal Psychiatric: Alert and oriented  Wet Prep: ph-4.5, whiff-negative no clue, yeast or trich :    Assessment:   Routine GYN Exam Physiologic Vaginal Discharge Recent Miscarriage   Plan:  Information given on Heartstrings  PAP sent  Patient to call back with pill brand she wants to resume-3 month supply and send to [facility] on [address].  RTO 1 year or prn  Nikelle Malatesta,ELMIRAPA-C

## 2012-08-25 NOTE — Progress Notes (Signed)
Regular Periods: no Mammogram: no  Monthly Breast Ex.: yes Exercise: no  Tetanus < 10 years: yes Seatbelts: yes  NI. Bladder Functn.: yes Abuse at home: no  Daily BM's: yes Stressful Work: no  Healthy Diet: yes Sigmoid-Colonoscopy: 10 years?  nl  Calcium: no Medical problems this year: feel like something is pushing on bladder   LAST PAP:11/12  Contraception: none  Mammogram:  no  PCP: DR. Kateri Plummer  PMH: NO CHANGE  FMH: NO CHANGE  Last Bone Scan: NO  PT IS SINGLE

## 2012-08-26 LAB — PAP IG W/ RFLX HPV ASCU

## 2012-08-27 LAB — URINE CULTURE: Colony Count: 50000

## 2012-09-07 ENCOUNTER — Telehealth: Payer: Self-pay | Admitting: Obstetrics and Gynecology

## 2012-09-07 NOTE — Telephone Encounter (Signed)
Tc to pt regarding msg.  Lm on vm to call back. 

## 2012-09-08 ENCOUNTER — Ambulatory Visit: Payer: Managed Care, Other (non HMO) | Admitting: Family Medicine

## 2012-09-08 VITALS — BP 149/88 | HR 98 | Temp 98.3°F | Resp 16 | Ht 67.0 in | Wt 205.0 lb

## 2012-09-08 DIAGNOSIS — N920 Excessive and frequent menstruation with regular cycle: Secondary | ICD-10-CM

## 2012-09-08 LAB — POCT CBC
Granulocyte percent: 66.8 %G (ref 37–80)
HCT, POC: 45.1 % (ref 37.7–47.9)
Hemoglobin: 14.7 g/dL (ref 12.2–16.2)
Lymph, poc: 3.1 (ref 0.6–3.4)
MCH, POC: 29.3 pg (ref 27–31.2)
MCHC: 32.6 g/dL (ref 31.8–35.4)
MCV: 89.8 fL (ref 80–97)
MID (cbc): 0.8 (ref 0–0.9)
MPV: 10.4 fL (ref 0–99.8)
POC Granulocyte: 7.9 — AB (ref 2–6.9)
POC LYMPH PERCENT: 26.2 %L (ref 10–50)
POC MID %: 7 %M (ref 0–12)
Platelet Count, POC: 244 10*3/uL (ref 142–424)
RBC: 5.02 M/uL (ref 4.04–5.48)
RDW, POC: 13.1 %
WBC: 11.8 10*3/uL — AB (ref 4.6–10.2)

## 2012-09-08 MED ORDER — DROSPIRENONE-ETHINYL ESTRADIOL 3-0.02 MG PO TABS: 1.0000 | ORAL_TABLET | Freq: Every day | ORAL | Status: DC

## 2012-09-08 NOTE — Telephone Encounter (Signed)
Tc to pt regarding msg, lm on vm to call back. 

## 2012-09-08 NOTE — Patient Instructions (Addendum)

## 2012-09-08 NOTE — Progress Notes (Signed)
32 yo with positive pregnancy test December 24th.  By January 8th she was seen for spotting and told she had threatened Ab.  She was supposed to have follow-up but this did not happen.  Never heard about her U/S at [facility]. Subsequently, two weeks later, she was seen for increased bleeding, increased pain, and fever at the ED at [facility].  She went back to her Ob-Gyn who found a cervical lesion and a biopsy was performed.  She had post-op followup on January 27th and was told she was recovering well.  Bx came back negative.  Fever has resolved.  Pain and bleeding is worse than ever.  ED refers to Ob and Ob refers to ED.  Has had to change pads every few hours.  Objective:  NAD Abdomen:  Soft, nontender, no masses Vaginal exam:  Normal introitus, few small clots (swabbed out), normal cvx, normal bimanual.  Results for orders placed in visit on 09/08/12  POCT CBC      Component Value Range   WBC 11.8 (*) 4.6 - 10.2 K/uL   Lymph, poc 3.1  0.6 - 3.4   POC LYMPH PERCENT 26.2  10 - 50 %L   MID (cbc) 0.8  0 - 0.9   POC MID % 7.0  0 - 12 %M   POC Granulocyte 7.9 (*) 2 - 6.9   Granulocyte percent 66.8  37 - 80 %G   RBC 5.02  4.04 - 5.48 M/uL   Hemoglobin 14.7  12.2 - 16.2 g/dL   HCT, POC 16.1  09.6 - 47.9 %   MCV 89.8  80 - 97 fL   MCH, POC 29.3  27 - 31.2 pg   MCHC 32.6  31.8 - 35.4 g/dL   RDW, POC 04.5     Platelet Count, POC 244  142 - 424 K/uL   MPV 10.4  0 - 99.8 fL   Assessment:  Menorrhagia  Plan:  Restart OCP  1. Menorrhagia  POCT CBC

## 2012-09-09 MED ORDER — VESTURA 3-0.02 MG PO TABS: 1.0000 | ORAL_TABLET | Freq: Every day | ORAL | Status: DC

## 2012-09-09 NOTE — Telephone Encounter (Signed)
Pt returned call, pt has been having pain in the RLQ and heavy bleeding, just recently had a MAB in January.  Pt states last Friday thru the weekend the bleeding was the heaviest that she has ever had along with the cramping and was unsure of what was going on.  She was changing a soaked pad Q 2-3 hours.  It has now tapered off.  Pt had an [place] and visit w/ AR and states she was unaware what the [place] results entailed but was told that everything looked fine.  Pt just has a lot of questions.  Informed will consult w/ EP and will call back with response.  Pt called back and lm that we need to schedule an appt w/ AR to f/u on her bleeding and questions.  Pt states that she went to the Urgent Care, the MD there reviewed her [place], put on her BCPs and suggested she have a f/u US to reeval the cysts on her ovaries.  Pt wants to know when that should be done.  C/w EP, suggests pt continue BCPs for 3 months and have a repeat US and visit w/ AR.  Pt voices agreement.  Pt put on recall list for scheduling.  Rx for pt's BCPs "Clarene Reamer" e-prescribed to her pharmacy.  Pt to call with any concerns prior to appt.

## 2012-09-29 ENCOUNTER — Telehealth: Payer: Self-pay | Admitting: Obstetrics and Gynecology

## 2012-09-29 MED ORDER — VESTURA 3-0.02 MG PO TABS: 1.0000 | ORAL_TABLET | Freq: Every day | ORAL | Status: DC

## 2012-09-29 NOTE — Telephone Encounter (Signed)
Spoke with pt rgd msg pt wants rx sent to different pharm [facility] not carry bc adbised pt will send rx to pharm pt voice understanding

## 2012-09-29 NOTE — Telephone Encounter (Signed)
Lm on vm tcb rgd msg 

## 2012-10-06 ENCOUNTER — Telehealth: Payer: Self-pay | Admitting: Radiology

## 2012-10-06 MED ORDER — DROSPIRENONE-ETHINYL ESTRADIOL 3-0.02 MG PO TABS: 1.0000 | ORAL_TABLET | Freq: Every day | ORAL | Status: DC

## 2012-10-06 MED ORDER — VESTURA 3-0.02 MG PO TABS: 1.0000 | ORAL_TABLET | Freq: Every day | ORAL | Status: DC

## 2012-10-06 NOTE — Telephone Encounter (Signed)
We have gotten fax for Yaz refill please advise. Was originally written by Dr Milus Glazier

## 2012-10-06 NOTE — Telephone Encounter (Signed)
Rx sent 

## 2012-10-07 NOTE — Telephone Encounter (Signed)
Pt advised.

## 2014-12-11 ENCOUNTER — Emergency Department (HOSPITAL_COMMUNITY)
Admission: EM | Admit: 2014-12-11 | Discharge: 2014-12-11 | Disposition: A | Discharge status: Home / Self Care | Payer: Managed Care, Other (non HMO) | Attending: Emergency Medicine | Admitting: Emergency Medicine

## 2014-12-11 ENCOUNTER — Encounter (HOSPITAL_COMMUNITY): Payer: Self-pay

## 2014-12-11 ENCOUNTER — Emergency Department (HOSPITAL_COMMUNITY): Payer: Managed Care, Other (non HMO)

## 2014-12-11 DIAGNOSIS — Z91040 Latex allergy status: Secondary | ICD-10-CM | POA: Diagnosis not present

## 2014-12-11 DIAGNOSIS — I10 Essential (primary) hypertension: Secondary | ICD-10-CM | POA: Diagnosis not present

## 2014-12-11 DIAGNOSIS — R112 Nausea with vomiting, unspecified: Secondary | ICD-10-CM

## 2014-12-11 DIAGNOSIS — R1013 Epigastric pain: Secondary | ICD-10-CM | POA: Diagnosis not present

## 2014-12-11 DIAGNOSIS — Z3202 Encounter for pregnancy test, result negative: Secondary | ICD-10-CM | POA: Insufficient documentation

## 2014-12-11 DIAGNOSIS — J45909 Unspecified asthma, uncomplicated: Secondary | ICD-10-CM | POA: Diagnosis not present

## 2014-12-11 DIAGNOSIS — R1012 Left upper quadrant pain: Secondary | ICD-10-CM | POA: Diagnosis not present

## 2014-12-11 DIAGNOSIS — R079 Chest pain, unspecified: Secondary | ICD-10-CM | POA: Diagnosis present

## 2014-12-11 DIAGNOSIS — Z79899 Other long term (current) drug therapy: Secondary | ICD-10-CM | POA: Insufficient documentation

## 2014-12-11 DIAGNOSIS — R1010 Upper abdominal pain, unspecified: Secondary | ICD-10-CM

## 2014-12-11 LAB — URINALYSIS, ROUTINE W REFLEX MICROSCOPIC
Bilirubin Urine: NEGATIVE
Glucose, UA: NEGATIVE mg/dL
Hgb urine dipstick: NEGATIVE
Ketones, ur: NEGATIVE mg/dL
Leukocytes, UA: NEGATIVE
Nitrite: NEGATIVE
Protein, ur: NEGATIVE mg/dL
Specific Gravity, Urine: 1.028 (ref 1.005–1.030)
Urobilinogen, UA: 0.2 mg/dL (ref 0.0–1.0)
pH: 6.5 (ref 5.0–8.0)

## 2014-12-11 LAB — CBC WITH DIFFERENTIAL/PLATELET
Basophils Absolute: 0 10*3/uL (ref 0.0–0.1)
Basophils Relative: 0 % (ref 0–1)
Eosinophils Absolute: 0.1 10*3/uL (ref 0.0–0.7)
Eosinophils Relative: 0 % (ref 0–5)
HCT: 43 % (ref 36.0–46.0)
Hemoglobin: 14.4 g/dL (ref 12.0–15.0)
Lymphocytes Relative: 14 % (ref 12–46)
Lymphs Abs: 2.6 10*3/uL (ref 0.7–4.0)
MCH: 28.7 pg (ref 26.0–34.0)
MCHC: 33.5 g/dL (ref 30.0–36.0)
MCV: 85.8 fL (ref 78.0–100.0)
Monocytes Absolute: 1.2 10*3/uL — ABNORMAL HIGH (ref 0.1–1.0)
Monocytes Relative: 7 % (ref 3–12)
Neutro Abs: 14.5 10*3/uL — ABNORMAL HIGH (ref 1.7–7.7)
Neutrophils Relative %: 79 % — ABNORMAL HIGH (ref 43–77)
Platelets: 243 10*3/uL (ref 150–400)
RBC: 5.01 MIL/uL (ref 3.87–5.11)
RDW: 13 % (ref 11.5–15.5)
WBC: 18.3 10*3/uL — ABNORMAL HIGH (ref 4.0–10.5)

## 2014-12-11 LAB — COMPREHENSIVE METABOLIC PANEL
ALT: 37 U/L (ref 14–54)
AST: 31 U/L (ref 15–41)
Albumin: 3.5 g/dL (ref 3.5–5.0)
Alkaline Phosphatase: 44 U/L (ref 38–126)
Anion gap: 6 (ref 5–15)
BUN: 21 mg/dL — ABNORMAL HIGH (ref 6–20)
CO2: 24 mmol/L (ref 22–32)
Calcium: 8.7 mg/dL — ABNORMAL LOW (ref 8.9–10.3)
Chloride: 107 mmol/L (ref 101–111)
Creatinine, Ser: 0.67 mg/dL (ref 0.44–1.00)
GFR calc Af Amer: >60 mL/min (ref >60)
GFR calc non Af Amer: >60 mL/min (ref >60)
Glucose, Bld: 107 mg/dL — ABNORMAL HIGH (ref 70–99)
Potassium: 4 mmol/L (ref 3.5–5.1)
Sodium: 137 mmol/L (ref 135–145)
Total Bilirubin: 0.6 mg/dL (ref 0.3–1.2)
Total Protein: 6.4 g/dL — ABNORMAL LOW (ref 6.5–8.1)

## 2014-12-11 LAB — POC URINE PREG, ED: Preg Test, Ur: NEGATIVE

## 2014-12-11 LAB — LIPASE, BLOOD: Lipase: 22 U/L (ref 22–51)

## 2014-12-11 MED ORDER — HYDROMORPHONE HCL 1 MG/ML IJ SOLN
0.5000 mg | Freq: Once | INTRAMUSCULAR | Status: AC
  Administered 2014-12-11: 0.5 mg via INTRAVENOUS
  Filled 2014-12-11: qty 1

## 2014-12-11 MED ORDER — DICYCLOMINE HCL 20 MG PO TABS: 20.0000 mg | ORAL_TABLET | Freq: Two times a day (BID) | ORAL | Status: DC

## 2014-12-11 MED ORDER — ONDANSETRON 4 MG PO TBDP: 4.0000 mg | ORAL_TABLET | Freq: Three times a day (TID) | ORAL | Status: DC | PRN

## 2014-12-11 MED ORDER — SUCRALFATE 1 G PO TABS: 1.0000 g | ORAL_TABLET | Freq: Three times a day (TID) | ORAL | Status: DC

## 2014-12-11 MED ORDER — GI COCKTAIL ~~LOC~~
30.0000 mL | Freq: Once | ORAL | Status: AC
  Administered 2014-12-11: 30 mL via ORAL
  Filled 2014-12-11: qty 30

## 2014-12-11 MED ORDER — ONDANSETRON HCL 4 MG/2ML IJ SOLN
4.0000 mg | Freq: Once | INTRAMUSCULAR | Status: AC
  Administered 2014-12-11: 4 mg via INTRAVENOUS
  Filled 2014-12-11: qty 2

## 2014-12-11 NOTE — ED Notes (Signed)
EDPA JOSH TO RE EVALUATE at bedside.

## 2014-12-11 NOTE — Discharge Instructions (Signed)
Please read and follow all provided instructions.  Your diagnoses today include:  1. Non-intractable vomiting with nausea, vomiting of unspecified type   2. Upper abdominal pain    Tests performed today include:  Blood counts and electrolytes  Blood tests to check liver and kidney function  Blood tests to check pancreas function  Urine test to look for infection  X-ray of the chest and abdomen - normal  Vital signs. See below for your results today.    Medications prescribed:   Zofran (ondansetron) - for nausea and vomiting   Carafate - for stomach upset and to protect your stomach   Bentyl - medication for GI spasm  Take any prescribed medications only as directed.  Home care instructions:   Follow any educational materials contained in this packet.   Your abdominal pain, nausea, vomiting, and diarrhea may be caused by a viral gastroenteritis also called 'stomach flu'. You should rest for the next several days. Keep drinking plenty of fluids and use the medicine for nausea as directed.    Drink clear liquids for the next 24 hours and introduce solid foods slowly after 24 hours using the b.r.a.t. diet (Bananas, Rice, Applesauce, Toast, Yogurt).    Follow-up instructions: Please follow-up with your primary care provider in the next 2 days for further evaluation of your symptoms. If you are not feeling better in 48 hours you may have a condition that is more serious and you need re-evaluation.   Return instructions:  SEEK IMMEDIATE MEDICAL ATTENTION IF:  If you have pain that does not go away or becomes severe   A temperature above 101F develops   Repeated vomiting occurs (multiple episodes)   If you have pain that becomes localized to portions of the abdomen. The right side could possibly be appendicitis. In an adult, the left lower portion of the abdomen could be colitis or diverticulitis.   Blood is being passed in stools or vomit (bright red or black tarry  stools)   You develop chest pain, difficulty breathing, dizziness or fainting, or become confused, poorly responsive, or inconsolable (young children)  If you have any other emergent concerns regarding your health  Additional Information: Abdominal (belly) pain can be caused by many things. Your caregiver performed an examination and possibly ordered blood/urine tests and imaging (CT scan, x-rays, ultrasound). Many cases can be observed and treated at home after initial evaluation in the emergency department. Even though you are being discharged home, abdominal pain can be unpredictable. Therefore, you need a repeated exam if your pain does not resolve, returns, or worsens. Most patients with abdominal pain don't have to be admitted to the hospital or have surgery, but serious problems like appendicitis and gallbladder attacks can start out as nonspecific pain. Many abdominal conditions cannot be diagnosed in one visit, so follow-up evaluations are very important.  Your vital signs today were: BP 120/82 mmHg   Pulse 64   Temp(Src) 98.2 F (36.8 C) (Oral)   Resp 19   Ht 5\' 8"  (1.727 m)   Wt 215 lb (97.523 kg)   BMI 32.70 kg/m2   SpO2 100%   LMP 11/17/2014 If your blood pressure (bp) was elevated above 135/85 this visit, please have this repeated by your doctor within one month. --------------

## 2014-12-11 NOTE — ED Provider Notes (Signed)
CSN: [id number]     Arrival date & time 12/11/14  0556 History   First MD Initiated Contact with Patient 12/11/14 0600     Chief Complaint  Patient presents with  . Chest Pain     (Consider location/radiation/quality/duration/timing/severity/associated sxs/prior Treatment) HPI Comments: Patient with history of cholecystectomy presents with acute onset of left lower chest pain and vomiting that began at approximately 4:30 this morning. Since that time, the pain is moved more into her left upper quadrant and epigastrium. Patient states that she vomited 4 times right after vomiting her symptoms are temporarily relieved but then returned. Pain is described as sharp and nonradiating. Patient denies fever, shortness of breath, diarrhea, urinary symptoms. Patient took Pepto-Bismol and Pepcid prior to coming here but these did not help. No known sick contacts. Patient denies heavy NSAID or heavy alcohol use. Recently started prednisone taper for facial swelling (not life-threatening reaction). Patient denies risk factors for pulmonary embolism including: unilateral leg swelling, history of DVT/PE/other blood clots, use of estrogens, recent immobilizations, recent surgery, recent travel (>4hr segment), malignancy, hemoptysis.    Patient is a 34 y.o. female presenting with chest pain. The history is provided by the patient.  Chest Pain Associated symptoms: abdominal pain, nausea and vomiting   Associated symptoms: no cough, no fever, no headache and no shortness of breath     Past Medical History  Diagnosis Date  . Asthma   . Hypertension     PRE   . Hx of migraines   . Abnormal Pap smear     cin-1   Past Surgical History  Procedure Laterality Date  . Vaginal septum removed  2005   Family History  Problem Relation Age of Onset  . Cancer Father     RENAL CELL CARCINOMA  . Hyperlipidemia Mother   . Thyroid disease Mother   . Hypertension Brother   . Thyroid disease Maternal Grandmother   .  Kidney disease Maternal Grandmother   . Heart disease Maternal Grandmother     A FIB  . Stroke Paternal Grandmother   . Diabetes Paternal Grandmother    History  Substance Use Topics  . Smoking status: Never Smoker   . Smokeless tobacco: Never Used  . Alcohol Use: No   OB History    Gravida Para Term Preterm AB TAB SAB Ectopic Multiple Living   0              Review of Systems  Constitutional: Negative for fever.  HENT: Negative for rhinorrhea and sore throat.   Eyes: Negative for redness.  Respiratory: Negative for cough and shortness of breath.   Cardiovascular: Positive for chest pain. Negative for leg swelling.  Gastrointestinal: Positive for nausea, vomiting and abdominal pain. Negative for diarrhea and blood in stool.  Genitourinary: Negative for dysuria.  Musculoskeletal: Negative for myalgias.  Skin: Negative for rash.  Neurological: Negative for headaches.    Allergies  Effexor; Elavil; Erythromycin; Iohexol; Latex; and Sulfa antibiotics  Home Medications   Prior to Admission medications   Medication Sig Start Date End Date Taking? Authorizing Provider  acetaminophen (TYLENOL) 500 MG tablet Take 1,000 mg by mouth every 6 (six) hours as needed. For headache   Yes Historical Provider, MD  ALPRAZolam (XANAX) 0.25 MG tablet Take 0.25 mg by mouth at bedtime as needed. Anxiety   Yes Historical Provider, MD  CALCIUM-MAGNESIUM PO Take 1 tablet by mouth daily.   Yes Historical Provider, MD  diphenhydrAMINE (BENADRYL) 25 MG tablet Take 25  mg by mouth every 6 (six) hours as needed for itching or allergies.   Yes Historical Provider, MD  montelukast (SINGULAIR) 10 MG tablet Take 10 mg by mouth at bedtime.  11/22/14  Yes Historical Provider, MD  omega-3 acid ethyl esters (LOVAZA) 1 G capsule Take 1 g by mouth daily.   Yes Historical Provider, MD  predniSONE (DELTASONE) 10 MG tablet Take 20-40 mg by mouth 2 (two) times daily with a meal. Pt takes  bid for 5 days and then 20  mg bid for 5 days 12/07/14  Yes Historical Provider, MD  drospirenone-ethinyl estradiol (YAZ,GIANVI,LORYNA) 3-0.02 MG tablet Take 1 tablet by mouth daily. Patient not taking: Reported on 12/11/2014 10/06/12   Anders Simmonds, PA-C  loperamide (IMODIUM) 2 MG capsule Take 1 capsule (2 mg total) by mouth 4 (four) times daily as needed for diarrhea or loose stools. Patient not taking: Reported on 12/11/2014 07/09/12   Tobin Chad, MD  ondansetron (ZOFRAN ODT) 4 MG disintegrating tablet Take 1 tablet (4 mg total) by mouth every 8 (eight) hours as needed for nausea. Patient not taking: Reported on 12/11/2014 07/09/12   Tobin Chad, MD  PROAIR HFA 108 (90 BASE) MCG/ACT inhaler Inhale 2 puffs into the lungs every 4 (four) hours as needed for wheezing.  11/22/14   Historical Provider, MD  promethazine (PHENERGAN) 25 MG suppository Place 1 suppository (25 mg total) rectally every 6 (six) hours as needed for nausea. Patient not taking: Reported on 12/11/2014 07/09/12   Tobin Chad, MD  promethazine (PHENERGAN) 25 MG tablet Take 1 tablet (25 mg total) by mouth every 6 (six) hours as needed for nausea. Patient not taking: Reported on 12/11/2014 07/09/12   Tobin Chad, MD   BP 136/78 mmHg  Pulse 63  Temp(Src) 98.1 F (36.7 C) (Oral)  Resp 18  Ht  (1.727 m)  Wt 215 lb (97.523 kg)  BMI 32.70 kg/m2  SpO2 100%  LMP 11/17/2014   Physical Exam  Constitutional: She appears well-developed and well-nourished.  HENT:  Head: Normocephalic and atraumatic.  Mouth/Throat: Oropharynx is clear and moist.  Eyes: Conjunctivae are normal. Pupils are equal, round, and reactive to light. Right eye exhibits no discharge. Left eye exhibits no discharge.  Neck: Normal range of motion. Neck supple.  Cardiovascular: Normal rate, regular rhythm and normal heart sounds.   No murmur heard. Pulmonary/Chest: Effort normal and breath sounds normal. No respiratory distress. She has no wheezes. She has no rales.  Abdominal:  Soft. Bowel sounds are normal. There is tenderness (mild epigastric and LUQ tenderness). There is no rebound and no guarding.  Neurological: She is alert.  Skin: Skin is warm and dry.  Psychiatric: She has a normal mood and affect.  Nursing note and vitals reviewed.   ED Course  Procedures (including critical care time) Labs Review Labs Reviewed  CBC WITH DIFFERENTIAL/PLATELET - Abnormal; Notable for the following:    WBC 18.3 (*)    Neutrophils Relative % 79 (*)    Neutro Abs 14.5 (*)    Monocytes Absolute 1.2 (*)    All other components within normal limits  COMPREHENSIVE METABOLIC PANEL - Abnormal; Notable for the following:    Glucose, Bld 107 (*)    BUN 21 (*)    Calcium 8.7 (*)    Total Protein 6.4 (*)    All other components within normal limits  LIPASE, BLOOD  URINALYSIS, ROUTINE W REFLEX MICROSCOPIC  POC URINE PREG, ED  Imaging Review Dg Abd Acute W/chest  12/11/2014   CLINICAL DATA:  sudden onset left epigastric and chest pain w/ concurrent vomiting (no nausea) x 4 hours today. Pt states hx prior cholecystectomy.  EXAM: DG ABDOMEN ACUTE W/ 1V CHEST  COMPARISON:  03/24/2009  FINDINGS: Heart size and mediastinal contours are within normal limits.  Lungs are clear. No effusion.  No free air. Normal bowel gas pattern.  There are no abnormal calcifications.  Regional bones unremarkable.  Surgical clips right upper abdomen.  IMPRESSION: No acute cardiopulmonary disease.  Negative abdominal radiographs.   Electronically Signed   By: Corlis Leak  Hassell M.D.   On: 12/11/2014 08:43     EKG Interpretation   Date/Time:  Sunday Dec 11 2014 06:05:14 EDT Ventricular Rate:  52 PR Interval:  149 QRS Duration: 91 QT Interval:  425 QTC Calculation: 395 R Axis:   64 Text Interpretation:  Sinus rhythm Sinus rhythm Normal ECG Confirmed by  Gerhard MunchLOCKWOOD, ROBERT  MD ([id number]) on 12/11/2014 7:39:37 AM       6:51 AM Patient seen and examined. Work-up initiated. Medications ordered.   Vital signs  reviewed and are as follows: BP 136/78 mmHg  Pulse 63  Temp(Src) 98.1 F (36.7 C) (Oral)  Resp 18  Ht 5\' 8"  (1.727 m)  Wt 215 lb (97.523 kg)  BMI 32.70 kg/m2  SpO2 100%  LMP 11/17/2014  10:43 AM Patient with no further vomiting in ED.   Patient with resolution of abdominal spasms and decrease in pain in epigastrium with GI cocktail.  Will discharge to home with Bentyl, Zofran, Carafate. Patient states that she can attempt to discontinue prednisone, however she has return of any allergic reaction symptoms that she should restart. Patient did not have life-threatening allergic reaction.  The patient was urged to return to the Emergency Department immediately with worsening of current symptoms, worsening abdominal pain, persistent vomiting, blood noted in stools, fever, or any other concerns. The patient verbalized understanding.    MDM   Final diagnoses:  Upper abdominal pain  Non-intractable vomiting with nausea, vomiting of unspecified type   Patient with signs and symptoms consistent with gastritis. Possibly caused by recent course of prednisone. Labs are reassuring. WBC count is elevated but not unexpectedly so. Symptoms improved with treatment in ED. Abdomen is soft. Patient with symptoms consistent with viral gastroenteritis. Vitals are stable, no fever.  Lungs are clear. No focal abdominal pain, no concern for appendicitis, cholecystitis, pancreatitis, ruptured viscus, UTI, kidney stone, or any other abdominal etiology. Supportive therapy indicated with return if symptoms worsen. Patient counseled.    Renne CriglerJoshua Braelynne Garinger, PA-C 12/11/14 1157  Gerhard Munchobert Lockwood, MD 12/11/14 [phone]

## 2014-12-11 NOTE — ED Notes (Signed)
EDPA PRESENT at bedside. 
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
## 2016-06-01 ENCOUNTER — Encounter (HOSPITAL_COMMUNITY): Payer: Self-pay | Admitting: *Deleted

## 2016-06-01 ENCOUNTER — Emergency Department (HOSPITAL_COMMUNITY): Payer: Managed Care, Other (non HMO)

## 2016-06-01 ENCOUNTER — Emergency Department (HOSPITAL_COMMUNITY)
Admission: EM | Admit: 2016-06-01 | Discharge: 2016-06-01 | Disposition: A | Discharge status: Home / Self Care | Payer: Managed Care, Other (non HMO) | Attending: Emergency Medicine | Admitting: Emergency Medicine

## 2016-06-01 DIAGNOSIS — Z91040 Latex allergy status: Secondary | ICD-10-CM | POA: Insufficient documentation

## 2016-06-01 DIAGNOSIS — Z79899 Other long term (current) drug therapy: Secondary | ICD-10-CM | POA: Insufficient documentation

## 2016-06-01 DIAGNOSIS — I10 Essential (primary) hypertension: Secondary | ICD-10-CM | POA: Diagnosis not present

## 2016-06-01 DIAGNOSIS — J45909 Unspecified asthma, uncomplicated: Secondary | ICD-10-CM | POA: Diagnosis not present

## 2016-06-01 DIAGNOSIS — R079 Chest pain, unspecified: Secondary | ICD-10-CM | POA: Insufficient documentation

## 2016-06-01 HISTORY — DX: Major depressive disorder, single episode, unspecified: F32.9

## 2016-06-01 HISTORY — DX: Depression, unspecified: F32.A

## 2016-06-01 LAB — CBC
HCT: 40.2 % (ref 36.0–46.0)
Hemoglobin: 13.6 g/dL (ref 12.0–15.0)
MCH: 30.1 pg (ref 26.0–34.0)
MCHC: 33.8 g/dL (ref 30.0–36.0)
MCV: 88.9 fL (ref 78.0–100.0)
Platelets: 186 10*3/uL (ref 150–400)
RBC: 4.52 MIL/uL (ref 3.87–5.11)
RDW: 12.4 % (ref 11.5–15.5)
WBC: 12.7 10*3/uL — ABNORMAL HIGH (ref 4.0–10.5)

## 2016-06-01 LAB — BASIC METABOLIC PANEL
Anion gap: 8 (ref 5–15)
BUN: 12 mg/dL (ref 6–20)
CO2: 25 mmol/L (ref 22–32)
Calcium: 9.2 mg/dL (ref 8.9–10.3)
Chloride: 109 mmol/L (ref 101–111)
Creatinine, Ser: 0.62 mg/dL (ref 0.44–1.00)
GFR calc Af Amer: >60 mL/min (ref >60)
GFR calc non Af Amer: >60 mL/min (ref >60)
Glucose, Bld: 98 mg/dL (ref 65–99)
Potassium: 3.3 mmol/L — ABNORMAL LOW (ref 3.5–5.1)
Sodium: 142 mmol/L (ref 135–145)

## 2016-06-01 LAB — I-STAT TROPONIN, ED: Troponin i, poc: 0 ng/mL (ref 0.00–0.08)

## 2016-06-01 MED ORDER — OMEPRAZOLE 20 MG PO CPDR: 20.0000 mg | DELAYED_RELEASE_CAPSULE | Freq: Every day | ORAL | 0 refills | Status: DC

## 2016-06-01 MED ORDER — GI COCKTAIL ~~LOC~~
30.0000 mL | Freq: Once | ORAL | Status: AC
  Administered 2016-06-01: 30 mL via ORAL
  Filled 2016-06-01: qty 30

## 2016-06-01 MED ORDER — ONDANSETRON 4 MG PO TBDP
4.0000 mg | ORAL_TABLET | Freq: Once | ORAL | Status: AC
  Administered 2016-06-01: 4 mg via ORAL
  Filled 2016-06-01: qty 1
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
## 2016-06-01 NOTE — ED Provider Notes (Signed)
[facility]-EMERGENCY DEPT Provider Note   CSN: [id number] Arrival date & time: 06/01/16  0028  By signing my name below, I, Octavia Heir, attest that this documentation has been prepared under the direction and in the presence of Everlene Farrier, PA-C.  Electronically Signed: Octavia Heir, ED Scribe. 06/01/16. 2:05 AM.    History   Chief Complaint Chief Complaint  Patient presents with  . Chest Pain    The history is provided by the patient. No language interpreter was used.   HPI Comments: [name] is a 35 y.o. female who presents to the Emergency Department complaining of acute onset, gradual worsening, moderate central chest pain that radiates in the left side of her chest about 45 minutes PTA. Pt says she went to use the restroom after sitting outside when she began to have sudden sharp chest pain. Pt notes having nausea about 15-20 minutes after her chest pain started but it has gradually subsided. she expresses that her pain is worse when lying down. She has taken Zantac to alleviate her pain with mild relief. Denies burping, belching, nausea, vomiting, shortness of breath, palpitations, pain when breathing, fall or injuries, hx of cardiac problems, numbness, tingling, fever, weakness, family hx of cardiac problems, non hormonal birth control, recent travel, recent surgery, leg pain, leg swelling, or hx of DVT, hx of HLD, and hx of HTN. Pt is a non-smoker. No personal or close family history of blood clotting disorders such as factor V Leiden, protein C or S deficiency.  Past Medical History:  Diagnosis Date  . Abnormal Pap smear    cin-1  . Asthma   . Depression   . Hx of migraines   . Hypertension    PRE     There are no active problems to display for this patient.   Past Surgical History:  Procedure Laterality Date  . CHOLECYSTECTOMY    . Vaginal septum removed  2005  . WISDOM TOOTH EXTRACTION      OB History    Gravida Para Term Preterm AB Living   0              SAB TAB Ectopic Multiple Live Births                   Home Medications    Prior to Admission medications   Medication Sig Start Date End Date Taking? Authorizing Provider  escitalopram (LEXAPRO) 10 MG tablet Take 10 mg by mouth daily. 03/13/16  Yes Historical Provider, MD  omega-3 acid ethyl esters (LOVAZA) 1 G capsule Take 1 g by mouth daily.   Yes Historical Provider, MD  PARAGARD INTRAUTERINE COPPER IUD IUD 1 each by Intrauterine route continuous.   Yes Historical Provider, MD  PROAIR HFA 108 (90 BASE) MCG/ACT inhaler Inhale 2 puffs into the lungs every 4 (four) hours as needed for wheezing.  11/22/14  Yes Historical Provider, MD  dicyclomine (BENTYL) 20 MG tablet Take 1 tablet (20 mg total) by mouth 2 (two) times daily. Patient not taking: Reported on 06/01/2016 12/11/14   Renne Crigler, PA-C  omeprazole (PRILOSEC) 20 MG capsule Take 1 capsule (20 mg total) by mouth daily. 06/01/16   Everlene Farrier, PA-C  ondansetron (ZOFRAN ODT) 4 MG disintegrating tablet Take 1 tablet (4 mg total) by mouth every 8 (eight) hours as needed for nausea or vomiting. Patient not taking: Reported on 06/01/2016 12/11/14   Renne Crigler, PA-C  sucralfate (CARAFATE) 1 G tablet Take 1 tablet (1 g total) by  mouth 4 (four) times daily -  with meals and at bedtime. Patient not taking: Reported on 06/01/2016 12/11/14   Renne CriglerJoshua Geiple, PA-C    Family History Family History  Problem Relation Age of Onset  . Cancer Father     RENAL CELL CARCINOMA  . Hyperlipidemia Mother   . Thyroid disease Mother   . Thyroid disease Maternal Grandmother   . Kidney disease Maternal Grandmother   . Heart disease Maternal Grandmother     A FIB  . Stroke Paternal Grandmother   . Diabetes Paternal Grandmother   . Hypertension Brother     Social History Social History  Substance Use Topics  . Smoking status: Never Smoker  . Smokeless tobacco: Never Used  . Alcohol use Yes     Comment: 3-4 beers per week      Allergies   Effexor [venlafaxine hcl]; Elavil [amitriptyline]; Erythromycin; Iohexol; Latex; and Sulfa antibiotics   Review of Systems Review of Systems  Constitutional: Negative for chills and fever.  HENT: Negative for congestion.   Eyes: Negative for visual disturbance.  Respiratory: Negative for cough, shortness of breath and wheezing.   Cardiovascular: Positive for chest pain. Negative for palpitations and leg swelling.  Gastrointestinal: Negative for abdominal pain, diarrhea, nausea and vomiting.  Genitourinary: Negative for dysuria.  Musculoskeletal: Negative for back pain and neck pain.  Skin: Negative for rash.  Neurological: Negative for dizziness, syncope, weakness, light-headedness, numbness and headaches.     Physical Exam Updated Vital Signs BP 133/93 (BP Location: Left Arm)   Pulse 71   Temp 97.6 F (36.4 C) (Oral)   Resp 18   Ht 5\' 7"  (1.702 m)   Wt 93 kg   LMP 05/31/2016   SpO2 100%   BMI 32.11 kg/m   Physical Exam  Constitutional: She appears well-developed and well-nourished. No distress.  Nontoxic appearing.  HENT:  Head: Normocephalic and atraumatic.  Right Ear: External ear normal.  Left Ear: External ear normal.  Mouth/Throat: Oropharynx is clear and moist.  Eyes: Conjunctivae are normal. Pupils are equal, round, and reactive to light. Right eye exhibits no discharge. Left eye exhibits no discharge.  Neck: Neck supple. No JVD present. No tracheal deviation present.  Cardiovascular: Normal rate, regular rhythm, normal heart sounds and intact distal pulses.  Exam reveals no gallop and no friction rub.   No murmur heard. Bilateral radial, posterior tibialis and dorsalis pedis pulses are intact.    Pulmonary/Chest: Effort normal and breath sounds normal. No stridor. No respiratory distress. She has no wheezes. She has no rales. She exhibits no tenderness.  Lungs are clear to auscultation bilaterally. Symmetric chest expansion bilaterally.   Abdominal: Soft. There is no tenderness. There is no guarding.  Musculoskeletal: She exhibits no edema or tenderness.  Bilateral DP/PT pulses intact  No lower extremity edema or tenderness  Lymphadenopathy:    She has no cervical adenopathy.  Neurological: She is alert. Coordination normal.  Skin: Skin is warm and dry. Capillary refill takes less than 2 seconds. No rash noted. She is not diaphoretic. No erythema. No pallor.  Psychiatric: She has a normal mood and affect. Her behavior is normal.  Nursing note and vitals reviewed.    ED Treatments / Results  DIAGNOSTIC STUDIES: Oxygen Saturation is 98% on RA, normal by my interpretation.  COORDINATION OF CARE:  2:03 AM Discussed treatment plan which includes GI cocktail with pt at bedside and pt agreed to plan.  Labs (all labs ordered are listed, but only  abnormal results are displayed) Labs Reviewed  BASIC METABOLIC PANEL - Abnormal; Notable for the following:       Result Value   Potassium 3.3 (*)    All other components within normal limits  CBC - Abnormal; Notable for the following:    WBC 12.7 (*)    All other components within normal limits  I-STAT TROPOININ, ED    EKG  EKG Interpretation  Date/Time:  Saturday June 01 2016 00:34:34 EDT Ventricular Rate:  69 PR Interval:    QRS Duration: 94 QT Interval:  393 QTC Calculation: 421 R Axis:   26 Text Interpretation:  Sinus rhythm Confirmed by [facility]  MD, APRIL ([id number]) on 06/01/2016 12:50:25 AM       Radiology Dg Chest 2 View  Result Date: 06/01/2016 CLINICAL DATA:  Initial evaluation for acute mid to left chest pain. History of asthma, hypertension. EXAM: CHEST  2 VIEW COMPARISON:  Prior radiograph from 12/11/2014. FINDINGS: The cardiac and mediastinal silhouettes are stable in size and contour, and remain within normal limits. The lungs are normally inflated. No airspace consolidation, pleural effusion, or pulmonary edema is identified. There is no  pneumothorax. No acute osseous abnormality identified. IMPRESSION: No active cardiopulmonary disease. Electronically Signed   By: Rise Mu M.D.   On: 06/01/2016 02:05    Procedures Procedures (including critical care time)  Medications Ordered in ED Medications  gi cocktail (Maalox,Lidocaine,Donnatal) (30 mLs Oral Given 06/01/16 0224)  ondansetron (ZOFRAN-ODT) disintegrating tablet 4 mg (4 mg Oral Given 06/01/16 0253)     Initial Impression / Assessment and Plan / ED Course  I have reviewed the triage vital signs and the nursing notes.  Pertinent labs & imaging results that were available during my care of the patient were reviewed by me and considered in my medical decision making (see chart for details).  Clinical Course    This is a 35 y.o. female who presents to the Emergency Department complaining of acute onset, gradual worsening, moderate central chest pain that radiates in the left side of her chest about 45 minutes PTA. Pt says she went to use the restroom after sitting outside when she began to have sudden sharp chest pain. Pt notes having nausea about 15-20 minutes after her chest pain started but it has gradually subsided. she expresses that her pain is worse when lying down. She has taken Zantac to alleviate her pain with mild relief.  Patient is to be discharged with recommendation to follow up with PCP in regards to today's hospital visit. Chest pain is not likely of cardiac or pulmonary etiology d/t presentation, PERC negative, VSS, no tracheal deviation, no JVD or new murmur, RRR, breath sounds equal bilaterally, EKG without acute abnormalities, negative troponin, and negative CXR. Pt has been advised start a PPI and return to the ED is CP becomes exertional, associated with diaphoresis or nausea, radiates to left jaw/arm, worsens or becomes concerning in any way. Pt appears reliable for follow up and is agreeable to discharge. I advised the patient to follow-up  with their primary care provider this week. I advised the patient to return to the emergency department with new or worsening symptoms or new concerns. The patient verbalized understanding and agreement with plan.     Final Clinical Impressions(s) / ED Diagnoses   Final diagnoses:  Nonspecific chest pain   I personally performed the services described in this documentation, which was scribed in my presence. The recorded information has been reviewed and is accurate.  New Prescriptions New Prescriptions   OMEPRAZOLE (PRILOSEC) 20 MG CAPSULE    Take 1 capsule (20 mg total) by mouth daily.     Everlene FarrierWilliam Martyna Thorns, PA-C 06/01/16 [phone]    April Palumbo, MD 06/01/16 [phone]

## 2016-06-01 NOTE — ED Triage Notes (Signed)
Pt states that she had a sudden onset of midsternal chest pain that radiates to left upper chest / shoulder area; pt c/o nausea with no vomiting; pt denies shortness of breath; pt states that the pain is like a pressure / heaviness

## 2016-12-11 ENCOUNTER — Ambulatory Visit
Admission: RE | Admit: 2016-12-11 | Discharge: 2016-12-11 | Disposition: A | Discharge status: Home / Self Care | Payer: 59 | Source: Ambulatory Visit | Attending: Obstetrics and Gynecology | Admitting: Obstetrics and Gynecology

## 2016-12-11 ENCOUNTER — Other Ambulatory Visit: Payer: Self-pay | Admitting: Obstetrics and Gynecology

## 2016-12-11 DIAGNOSIS — Z975 Presence of (intrauterine) contraceptive device: Secondary | ICD-10-CM

## 2018-05-01 ENCOUNTER — Encounter: Payer: Self-pay | Admitting: *Deleted

## 2018-05-01 ENCOUNTER — Encounter (HOSPITAL_COMMUNITY): Payer: Self-pay | Admitting: *Deleted

## 2018-06-09 ENCOUNTER — Ambulatory Visit: Payer: Managed Care, Other (non HMO) | Admitting: Psychiatry

## 2018-06-16 ENCOUNTER — Ambulatory Visit: Payer: Self-pay | Admitting: Psychiatry

## 2018-06-18 ENCOUNTER — Encounter: Payer: Self-pay | Admitting: Emergency Medicine

## 2018-06-18 DIAGNOSIS — F329 Major depressive disorder, single episode, unspecified: Secondary | ICD-10-CM | POA: Insufficient documentation

## 2018-06-18 DIAGNOSIS — F32A Depression, unspecified: Secondary | ICD-10-CM | POA: Insufficient documentation

## 2018-06-30 ENCOUNTER — Ambulatory Visit (INDEPENDENT_AMBULATORY_CARE_PROVIDER_SITE_OTHER): Payer: 59 | Admitting: Psychiatry

## 2018-06-30 DIAGNOSIS — F411 Generalized anxiety disorder: Secondary | ICD-10-CM | POA: Diagnosis not present

## 2018-06-30 DIAGNOSIS — F32A Depression, unspecified: Secondary | ICD-10-CM

## 2018-06-30 DIAGNOSIS — F329 Major depressive disorder, single episode, unspecified: Secondary | ICD-10-CM

## 2018-06-30 MED ORDER — MODAFINIL 200 MG PO TABS: ORAL_TABLET | ORAL | 1 refills | Status: DC

## 2018-06-30 MED ORDER — ESCITALOPRAM OXALATE 10 MG PO TABS: 10.0000 mg | ORAL_TABLET | Freq: Every day | ORAL | 1 refills | Status: DC

## 2018-06-30 MED ORDER — ALPRAZOLAM 0.5 MG PO TABS: 0.5000 mg | ORAL_TABLET | Freq: Every day | ORAL | 1 refills | Status: DC | PRN

## 2018-06-30 NOTE — Progress Notes (Addendum)
buspar rx. Was hand written as unable to do on computer.     [facility]  Patient ID: [name],  MRN: [id number]  PCP: Laurell JosephsMorrow, Aaron P, MD (Inactive)  Date of Evaluation: 06/30/2018 Time spent:20 minutes  Chief Complaint:   HISTORY/CURRENT STATUS: HPI patient last seen August of this year.  Started her on Provigil 100 mg a day for decreased motivation.  Continue her Lexapro at 10 mg a day. Currently her depression is about the same.  Increased anxiety.  Had to put her dog down and planning a wedding for next year or stressors for her.  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 She continues to have depression for 5 days before her period starts.  Increasing Lexapro in the past has not helped. Individual Medical History/ Review of Systems: Changes? :No   Allergies: Effexor [venlafaxine hcl]; Elavil [amitriptyline]; Erythromycin; Iohexol; Latex; and Sulfa antibiotics  Current Medications:  Current Outpatient Medications:  .  ALPRAZolam (XANAX) 0.5 MG tablet, Take 1 tablet (0.5 mg total) by mouth daily as needed for anxiety., Disp: 30 tablet, Rfl: 1 .  dicyclomine (BENTYL) 20 MG tablet, Take 1 tablet (20 mg total) by mouth 2 (two) times daily., Disp: 20 tablet, Rfl: 0 .  escitalopram (LEXAPRO) 10 MG tablet, Take 1 tablet (10 mg total) by mouth daily., Disp: 30 tablet, Rfl: 1 .  modafinil (PROVIGIL) 200 MG tablet, 1/day, Disp: 30 tablet, Rfl: 1 .  omega-3 acid ethyl esters (LOVAZA) 1 G capsule, Take 1 g by mouth daily., Disp: , Rfl:  .  PROAIR HFA 108 (90 BASE) MCG/ACT inhaler, Inhale 2 puffs into the lungs every 4 (four) hours as needed for wheezing. , Disp: , Rfl: 5 .  omeprazole (PRILOSEC) 20 MG capsule, Take 1 capsule (20 mg total) by mouth daily. (Patient not taking: Reported on 06/30/2018), Disp: 30 capsule, Rfl: 0 .  ondansetron (ZOFRAN ODT) 4 MG disintegrating tablet, Take 1 tablet (4 mg total) by mouth every 8 (eight) hours as needed for nausea or vomiting.  (Patient not taking: Reported on 06/01/2016), Disp: 10 tablet, Rfl: 0 .  PARAGARD INTRAUTERINE COPPER IUD IUD, 1 each by Intrauterine route continuous., Disp: , Rfl:  .  sucralfate (CARAFATE) 1 G tablet, Take 1 tablet (1 g total) by mouth 4 (four) times daily -  with meals and at bedtime. (Patient not taking: Reported on 06/01/2016), Disp: 30 tablet, Rfl: 0 Medication Side Effects: none  Family Medical/ Social History: Changes? No  MENTAL HEALTH EXAM:  There were no vitals taken for this visit.There is no height or weight on file to calculate BMI.  General Appearance: Casual  Eye Contact:  Good  Speech:  Normal Rate  Volume:  Normal  Mood:  Depressed  Affect:  Appropriate  Thought Process:  Linear  Orientation:  Full (Time, Place, and Person)  Thought Content: Logical   Suicidal Thoughts:  No  Homicidal Thoughts:  No  Memory:  WNL  Judgement:  Good  Insight:  Good  Psychomotor Activity:  Normal  Concentration:  Concentration: Good  Recall:  Good  Fund of Knowledge: Good  Language: Good  Assets:  Desire for Improvement  ADL's:  Intact  Cognition: WNL  Prognosis:  Good    DIAGNOSES:    ICD-10-CM   1. Depression, unspecified depression type F32.9 Comprehensive metabolic panel    TSH    CBC    Vitamin D 1,25 dihydroxy    Folate    Vitamin B12  2. Anxiety state F41.1     Receiving Psychotherapy: no   RECOMMENDATIONS: We will increase her Provigil to 200 mg a day she continues to have poor motivation.  We are going to get routine labs which include CMP, TSH, CBC, vitamin D, folate, B12. Continue Lexapro 10 mg a day.  Start BuSpar 15 mg one half twice daily for a week and then 1 twice daily.  She is to continue on her Xanax 0.5 mg 1 or 2 a day.  Other medicines that we could try in the future include trazodone and Remeron, I will see her again in a month and we will check her vital signs at that time.  Has not used these yet.  Anne Fu, PA-C

## 2018-08-04 ENCOUNTER — Ambulatory Visit (INDEPENDENT_AMBULATORY_CARE_PROVIDER_SITE_OTHER): Payer: 59 | Admitting: Psychiatry

## 2018-08-04 DIAGNOSIS — F3281 Premenstrual dysphoric disorder: Secondary | ICD-10-CM | POA: Diagnosis not present

## 2018-08-04 DIAGNOSIS — F3289 Other specified depressive episodes: Secondary | ICD-10-CM

## 2018-08-04 MED ORDER — ALPRAZOLAM 0.5 MG PO TABS: 0.5000 mg | ORAL_TABLET | Freq: Every day | ORAL | 0 refills | Status: DC | PRN

## 2018-08-04 MED ORDER — ESCITALOPRAM OXALATE 10 MG PO TABS: ORAL_TABLET | ORAL | 0 refills | Status: DC

## 2018-08-04 NOTE — Progress Notes (Signed)
[facility]  Patient ID: [name],  MRN: [id number]  PCP: Laurell JosephsMorrow, Aaron P, MD (Inactive)  Date of Evaluation: 08/04/2018 Time spent:20 minutes  Chief Complaint:   HISTORY/CURRENT STATUS: HPI seen 06/30/18. Continues with PMDD and poor energy. Labs ordered but not done yet. Started buspar. PMDD better on lexapro 15 mg before menses. Forgets am dose of buspar. Multiple stressors.  Individual Medical History/ Review of Systems: Changes? :No   Allergies: Effexor [venlafaxine hcl]; Elavil [amitriptyline]; Erythromycin; Iohexol; Latex; and Sulfa antibiotics  Current Medications:  Current Outpatient Medications:  .  ALPRAZolam (XANAX) 0.5 MG tablet, Take 1 tablet (0.5 mg total) by mouth daily as needed for anxiety., Disp: 30 tablet, Rfl: 1 .  dicyclomine (BENTYL) 20 MG tablet, Take 1 tablet (20 mg total) by mouth 2 (two) times daily., Disp: 20 tablet, Rfl: 0 .  escitalopram (LEXAPRO) 10 MG tablet, Take 1 tablet (10 mg total) by mouth daily., Disp: 30 tablet, Rfl: 1 .  modafinil (PROVIGIL) 200 MG tablet, 1/day, Disp: 30 tablet, Rfl: 1 .  omega-3 acid ethyl esters (LOVAZA) 1 G capsule, Take 1 g by mouth daily., Disp: , Rfl:  .  omeprazole (PRILOSEC) 20 MG capsule, Take 1 capsule (20 mg total) by mouth daily. (Patient not taking: Reported on 06/30/2018), Disp: 30 capsule, Rfl: 0 .  ondansetron (ZOFRAN ODT) 4 MG disintegrating tablet, Take 1 tablet (4 mg total) by mouth every 8 (eight) hours as needed for nausea or vomiting. (Patient not taking: Reported on 06/01/2016), Disp: 10 tablet, Rfl: 0 .  PARAGARD INTRAUTERINE COPPER IUD IUD, 1 each by Intrauterine route continuous., Disp: , Rfl:  .  PROAIR HFA 108 (90 BASE) MCG/ACT inhaler, Inhale 2 puffs into the lungs every 4 (four) hours as needed for wheezing. , Disp: , Rfl: 5 .  sucralfate (CARAFATE) 1 G tablet, Take 1 tablet (1 g total) by mouth 4 (four) times daily -  with meals and at bedtime. (Patient not taking:  Reported on 06/01/2016), Disp: 30 tablet, Rfl: 0 Medication Side Effects: none  Family Medical/ Social History: Changes? No  MENTAL HEALTH EXAM:  There were no vitals taken for this visit.There is no height or weight on file to calculate BMI.  General Appearance: Casual  Eye Contact:  Good  Speech:  Clear and Coherent  Volume:  Normal  Mood:  Euthymic  Affect:  Appropriate  Thought Process:  Linear  Orientation:  Full (Time, Place, and Person)  Thought Content: WDL   Suicidal Thoughts:  No  Homicidal Thoughts:  No  Memory:  WNL  Judgement:  Good  Insight:  Good  Psychomotor Activity:  Normal  Concentration:  Concentration: Good  Recall:  Good  Fund of Knowledge: Good  Language: Good  Assets:  Desire for Improvement  ADL's:  Intact  Cognition: WNL  Prognosis:  Good    DIAGNOSES:    ICD-10-CM   1. Other depression F32.89   2. PMDD (premenstrual dysphoric disorder) F32.81     Receiving Psychotherapy: No    RECOMMENDATIONS: vs - 151/100 and recheck 161/102  Pulse ok,  To check with pcp about vs. Not on provigil Continue xanax, lexapro, hand writing buspar to try to take bid. Try to get fatigue labs   R.R. DonnelleyClay Ireta Pullman, PA-C

## 2018-10-27 ENCOUNTER — Telehealth: Payer: Self-pay | Admitting: Psychiatry

## 2018-10-27 ENCOUNTER — Other Ambulatory Visit: Payer: Self-pay

## 2018-10-27 MED ORDER — ESCITALOPRAM OXALATE 10 MG PO TABS: ORAL_TABLET | ORAL | 0 refills | Status: DC

## 2018-10-27 NOTE — Telephone Encounter (Signed)
Refill submitted to [facility] per request

## 2018-10-27 NOTE — Telephone Encounter (Signed)
[facility] Patient rs w/Dr. Jennelle Human for 05/07 need refill on Lexipro 15 mg 1x a day for 90 days to be sent to [facility] on [address] of [address]

## 2018-11-03 ENCOUNTER — Ambulatory Visit: Payer: 59 | Admitting: Psychiatry

## 2018-12-10 ENCOUNTER — Encounter: Payer: Self-pay | Admitting: Psychiatry

## 2018-12-10 ENCOUNTER — Other Ambulatory Visit: Payer: Self-pay

## 2018-12-10 ENCOUNTER — Ambulatory Visit (INDEPENDENT_AMBULATORY_CARE_PROVIDER_SITE_OTHER): Payer: 59 | Admitting: Psychiatry

## 2018-12-10 DIAGNOSIS — F331 Major depressive disorder, recurrent, moderate: Secondary | ICD-10-CM

## 2018-12-10 DIAGNOSIS — R5382 Chronic fatigue, unspecified: Secondary | ICD-10-CM

## 2018-12-10 DIAGNOSIS — F411 Generalized anxiety disorder: Secondary | ICD-10-CM | POA: Diagnosis not present

## 2018-12-10 DIAGNOSIS — F4310 Post-traumatic stress disorder, unspecified: Secondary | ICD-10-CM

## 2018-12-10 DIAGNOSIS — F3281 Premenstrual dysphoric disorder: Secondary | ICD-10-CM | POA: Diagnosis not present

## 2018-12-10 MED ORDER — BUPROPION HCL ER (XL) 150 MG PO TB24: 150.0000 mg | ORAL_TABLET | Freq: Every day | ORAL | 1 refills | Status: DC

## 2018-12-10 MED ORDER — ESCITALOPRAM OXALATE 10 MG PO TABS: ORAL_TABLET | ORAL | 1 refills | Status: DC
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
## 2019-06-05 ENCOUNTER — Other Ambulatory Visit: Payer: Self-pay | Admitting: Psychiatry

## 2019-06-05 DIAGNOSIS — F4310 Post-traumatic stress disorder, unspecified: Secondary | ICD-10-CM

## 2019-06-05 DIAGNOSIS — F331 Major depressive disorder, recurrent, moderate: Secondary | ICD-10-CM

## 2019-06-05 DIAGNOSIS — F3281 Premenstrual dysphoric disorder: Secondary | ICD-10-CM

## 2019-06-11 ENCOUNTER — Ambulatory Visit (INDEPENDENT_AMBULATORY_CARE_PROVIDER_SITE_OTHER): Payer: 59 | Admitting: Psychiatry

## 2019-06-11 ENCOUNTER — Encounter: Payer: Self-pay | Admitting: Psychiatry

## 2019-06-11 ENCOUNTER — Other Ambulatory Visit: Payer: Self-pay

## 2019-06-11 DIAGNOSIS — F4310 Post-traumatic stress disorder, unspecified: Secondary | ICD-10-CM

## 2019-06-11 DIAGNOSIS — F411 Generalized anxiety disorder: Secondary | ICD-10-CM | POA: Diagnosis not present

## 2019-06-11 DIAGNOSIS — F3281 Premenstrual dysphoric disorder: Secondary | ICD-10-CM | POA: Diagnosis not present

## 2019-06-11 DIAGNOSIS — F331 Major depressive disorder, recurrent, moderate: Secondary | ICD-10-CM | POA: Diagnosis not present

## 2019-06-11 DIAGNOSIS — F5105 Insomnia due to other mental disorder: Secondary | ICD-10-CM

## 2019-06-11 MED ORDER — ALPRAZOLAM 0.5 MG PO TABS: 0.5000 mg | ORAL_TABLET | Freq: Every day | ORAL | 0 refills | Status: DC | PRN

## 2019-06-11 MED ORDER — BUPROPION HCL ER (XL) 150 MG PO TB24: 150.0000 mg | ORAL_TABLET | Freq: Every day | ORAL | 1 refills | Status: DC
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
## 2019-10-08 ENCOUNTER — Ambulatory Visit
Admission: RE | Admit: 2019-10-08 | Discharge: 2019-10-08 | Disposition: A | Discharge status: Home / Self Care | Payer: 59 | Source: Ambulatory Visit | Attending: Family Medicine | Admitting: Family Medicine

## 2019-10-08 ENCOUNTER — Other Ambulatory Visit: Payer: Self-pay | Admitting: Family Medicine

## 2019-10-08 ENCOUNTER — Other Ambulatory Visit: Payer: Self-pay

## 2019-10-08 DIAGNOSIS — R059 Cough, unspecified: Secondary | ICD-10-CM

## 2019-10-08 DIAGNOSIS — R05 Cough: Secondary | ICD-10-CM

## 2019-11-14 ENCOUNTER — Other Ambulatory Visit: Payer: Self-pay | Admitting: Psychiatry

## 2019-11-14 DIAGNOSIS — F3281 Premenstrual dysphoric disorder: Secondary | ICD-10-CM

## 2019-11-14 DIAGNOSIS — F5105 Insomnia due to other mental disorder: Secondary | ICD-10-CM

## 2019-11-14 DIAGNOSIS — F411 Generalized anxiety disorder: Secondary | ICD-10-CM

## 2019-12-06 ENCOUNTER — Encounter: Payer: Self-pay | Admitting: Psychiatry

## 2019-12-06 ENCOUNTER — Ambulatory Visit (INDEPENDENT_AMBULATORY_CARE_PROVIDER_SITE_OTHER): Payer: 59 | Admitting: Psychiatry

## 2019-12-06 ENCOUNTER — Other Ambulatory Visit: Payer: Self-pay

## 2019-12-06 DIAGNOSIS — F3281 Premenstrual dysphoric disorder: Secondary | ICD-10-CM

## 2019-12-06 DIAGNOSIS — F331 Major depressive disorder, recurrent, moderate: Secondary | ICD-10-CM

## 2019-12-06 DIAGNOSIS — F411 Generalized anxiety disorder: Secondary | ICD-10-CM

## 2019-12-06 DIAGNOSIS — F4310 Post-traumatic stress disorder, unspecified: Secondary | ICD-10-CM | POA: Diagnosis not present

## 2019-12-06 DIAGNOSIS — F5105 Insomnia due to other mental disorder: Secondary | ICD-10-CM

## 2019-12-06 MED ORDER — BUPROPION HCL ER (SR) 100 MG PO TB12: 100.0000 mg | ORAL_TABLET | Freq: Two times a day (BID) | ORAL | 0 refills | Status: DC

## 2019-12-06 MED ORDER — ESCITALOPRAM OXALATE 10 MG PO TABS: ORAL_TABLET | ORAL | 1 refills | Status: DC
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
## 2020-02-13 ENCOUNTER — Other Ambulatory Visit: Payer: Self-pay | Admitting: Psychiatry

## 2020-02-13 DIAGNOSIS — F331 Major depressive disorder, recurrent, moderate: Secondary | ICD-10-CM

## 2020-03-03 ENCOUNTER — Emergency Department (HOSPITAL_COMMUNITY): Payer: No Typology Code available for payment source

## 2020-03-03 ENCOUNTER — Encounter (HOSPITAL_COMMUNITY): Payer: Self-pay

## 2020-03-03 ENCOUNTER — Emergency Department (HOSPITAL_COMMUNITY)
Admission: EM | Admit: 2020-03-03 | Discharge: 2020-03-03 | Disposition: A | Discharge status: Home / Self Care | Payer: No Typology Code available for payment source | Attending: Emergency Medicine | Admitting: Emergency Medicine

## 2020-03-03 ENCOUNTER — Other Ambulatory Visit: Payer: Self-pay | Admitting: Psychiatry

## 2020-03-03 DIAGNOSIS — M791 Myalgia, unspecified site: Secondary | ICD-10-CM | POA: Diagnosis not present

## 2020-03-03 DIAGNOSIS — J45909 Unspecified asthma, uncomplicated: Secondary | ICD-10-CM | POA: Insufficient documentation

## 2020-03-03 DIAGNOSIS — M79602 Pain in left arm: Secondary | ICD-10-CM | POA: Diagnosis not present

## 2020-03-03 DIAGNOSIS — R079 Chest pain, unspecified: Secondary | ICD-10-CM | POA: Diagnosis present

## 2020-03-03 DIAGNOSIS — M5412 Radiculopathy, cervical region: Secondary | ICD-10-CM

## 2020-03-03 DIAGNOSIS — I10 Essential (primary) hypertension: Secondary | ICD-10-CM | POA: Diagnosis not present

## 2020-03-03 DIAGNOSIS — M25512 Pain in left shoulder: Secondary | ICD-10-CM | POA: Diagnosis not present

## 2020-03-03 DIAGNOSIS — Z91040 Latex allergy status: Secondary | ICD-10-CM | POA: Diagnosis not present

## 2020-03-03 DIAGNOSIS — M546 Pain in thoracic spine: Secondary | ICD-10-CM | POA: Diagnosis not present

## 2020-03-03 DIAGNOSIS — M542 Cervicalgia: Secondary | ICD-10-CM | POA: Insufficient documentation

## 2020-03-03 DIAGNOSIS — R110 Nausea: Secondary | ICD-10-CM | POA: Diagnosis not present

## 2020-03-03 DIAGNOSIS — F331 Major depressive disorder, recurrent, moderate: Secondary | ICD-10-CM

## 2020-03-03 LAB — BASIC METABOLIC PANEL
Anion gap: 9 (ref 5–15)
BUN: 15 mg/dL (ref 6–20)
CO2: 23 mmol/L (ref 22–32)
Calcium: 9.2 mg/dL (ref 8.9–10.3)
Chloride: 104 mmol/L (ref 98–111)
Creatinine, Ser: 0.82 mg/dL (ref 0.44–1.00)
GFR calc Af Amer: >60 mL/min (ref >60)
GFR calc non Af Amer: >60 mL/min (ref >60)
Glucose, Bld: 138 mg/dL — ABNORMAL HIGH (ref 70–99)
Potassium: 4 mmol/L (ref 3.5–5.1)
Sodium: 136 mmol/L (ref 135–145)

## 2020-03-03 LAB — TROPONIN I (HIGH SENSITIVITY)
Troponin I (High Sensitivity): <2 ng/L (ref <18)
Troponin I (High Sensitivity): <2 ng/L (ref <18)

## 2020-03-03 LAB — CBC
HCT: 43.5 % (ref 36.0–46.0)
Hemoglobin: 14.7 g/dL (ref 12.0–15.0)
MCH: 29.9 pg (ref 26.0–34.0)
MCHC: 33.8 g/dL (ref 30.0–36.0)
MCV: 88.6 fL (ref 80.0–100.0)
Platelets: 262 10*3/uL (ref 150–400)
RBC: 4.91 MIL/uL (ref 3.87–5.11)
RDW: 12.5 % (ref 11.5–15.5)
WBC: 16.4 10*3/uL — ABNORMAL HIGH (ref 4.0–10.5)
nRBC: 0 % (ref 0.0–0.2)

## 2020-03-03 LAB — I-STAT BETA HCG BLOOD, ED (MC, WL, AP ONLY): I-stat hCG, quantitative: <5 m[IU]/mL (ref <5)

## 2020-03-03 MED ORDER — METHOCARBAMOL 500 MG PO TABS
500.0000 mg | ORAL_TABLET | Freq: Once | ORAL | Status: AC
  Administered 2020-03-03: 500 mg via ORAL
  Filled 2020-03-03: qty 1

## 2020-03-03 MED ORDER — AMLODIPINE BESYLATE 5 MG PO TABS
5.0000 mg | ORAL_TABLET | Freq: Once | ORAL | Status: AC
  Administered 2020-03-03: 5 mg via ORAL
  Filled 2020-03-03 (×2): qty 1

## 2020-03-03 MED ORDER — PREDNISONE 20 MG PO TABS
60.0000 mg | ORAL_TABLET | Freq: Once | ORAL | Status: AC
  Administered 2020-03-03: 60 mg via ORAL
  Filled 2020-03-03: qty 3

## 2020-03-03 MED ORDER — METHOCARBAMOL 500 MG PO TABS
500.0000 mg | ORAL_TABLET | Freq: Three times a day (TID) | ORAL | 0 refills | Status: DC | PRN
Start: 2020-03-03 — End: 2021-09-26

## 2020-03-03 MED ORDER — HYDROCODONE-ACETAMINOPHEN 5-325 MG PO TABS
1.0000 | ORAL_TABLET | Freq: Once | ORAL | Status: AC
  Administered 2020-03-03: 1 via ORAL
  Filled 2020-03-03: qty 1

## 2020-03-03 MED ORDER — DIPHENHYDRAMINE HCL 25 MG PO CAPS: 50.0000 mg | ORAL_CAPSULE | Freq: Once | ORAL | Status: AC

## 2020-03-03 MED ORDER — ONDANSETRON 4 MG PO TBDP
4.0000 mg | ORAL_TABLET | Freq: Once | ORAL | Status: AC
  Administered 2020-03-03: 4 mg via ORAL
  Filled 2020-03-03: qty 1

## 2020-03-03 MED ORDER — DIPHENHYDRAMINE HCL 50 MG/ML IJ SOLN
50.0000 mg | Freq: Once | INTRAMUSCULAR | Status: AC
  Administered 2020-03-03: 50 mg via INTRAVENOUS
  Filled 2020-03-03: qty 1

## 2020-03-03 MED ORDER — SODIUM CHLORIDE (PF) 0.9 % IJ SOLN
INTRAMUSCULAR | Status: AC
  Filled 2020-03-03: qty 50

## 2020-03-03 MED ORDER — HYDROCORTISONE NA SUCCINATE PF 250 MG IJ SOLR
200.0000 mg | Freq: Once | INTRAMUSCULAR | Status: AC
  Administered 2020-03-03: 200 mg via INTRAVENOUS
  Filled 2020-03-03: qty 200

## 2020-03-03 MED ORDER — IOHEXOL 350 MG/ML SOLN
80.0000 mL | Freq: Once | INTRAVENOUS | Status: AC | PRN
  Administered 2020-03-03: 80 mL via INTRAVENOUS

## 2020-03-03 MED ORDER — METHYLPREDNISOLONE 4 MG PO TBPK
ORAL_TABLET | ORAL | 0 refills | Status: DC
Start: 2020-03-03 — End: 2021-09-26

## 2020-03-03 NOTE — ED Provider Notes (Signed)
[facility]-EMERGENCY DEPT Provider Note   CSN: [id number] Arrival date & time: 03/03/20  0129     History Chief Complaint  Patient presents with  . Chest Pain    [name] is a 39 y.o. female.  Patient here with left-sided neck, shoulder and arm pain that woke her from sleep approximately 1 AM.  States it did radiate into her chest but is not doing that any longer.  He reports pain to the left side of her neck, upper back and shoulder that goes down her left arm.  She had some nausea but no vomiting.  No shortness of breath, cough, fever.  No vomiting or diaphoresis.  States she felt well when she went to bed and woke up with pain to the left side of her neck and upper back and shoulder.  Denies any injury or fall.  Has never had this pain before.  Is some intermittent tingling in her fingertips.  Denies any weakness in the arm. Denies any cardiac history.  Has history of hypertension, asthma, depression.  Does take birth control pills. No headache or visual changes.  The history is provided by the patient.       Past Medical History:  Diagnosis Date  . Abnormal Pap smear    cin-1  . Asthma   . Depression   . Hx of migraines   . Hypertension    PRE     Patient Active Problem List   Diagnosis Date Noted  . Depressed 06/18/2018    Past Surgical History:  Procedure Laterality Date  . CHOLECYSTECTOMY    . Vaginal septum removed  2005  . WISDOM TOOTH EXTRACTION       OB History    Gravida  0   Para      Term      Preterm      AB      Living        SAB      TAB      Ectopic      Multiple      Live Births              Family History  Problem Relation Age of Onset  . Cancer Father        RENAL CELL CARCINOMA  . Hyperlipidemia Mother   . Thyroid disease Mother   . Thyroid disease Maternal Grandmother   . Kidney disease Maternal Grandmother   . Heart disease Maternal Grandmother        A FIB  . Stroke  Paternal Grandmother   . Diabetes Paternal Grandmother   . Hypertension Brother     Social History   Tobacco Use  . Smoking status: Never Smoker  . Smokeless tobacco: Never Used  Substance Use Topics  . Alcohol use: Yes    Comment: 3-4 beers per week  . Drug use: No    Home Medications Prior to Admission medications   Medication Sig Start Date End Date Taking? Authorizing Provider  ALPRAZolam Prudy Feeler) 0.5 MG tablet TAKE 1 TABLET BY MOUTH EVERY DAY AS NEEDED FOR ANXIETY 11/15/19   Cottle, Steva Ready., MD  buPROPion [facility] SR) 100 MG 12 hr tablet TAKE 1 TABLET BY MOUTH TWICE A DAY 03/03/20   Cottle, Steva Ready., MD  cyclobenzaprine (FLEXERIL) 5 MG tablet cyclobenzaprine 5 mg tablet  Take one or two  tablets every day as needed.    [provider]  dicyclomine (BENTYL) 20  MG tablet Take 1 tablet (20 mg total) by mouth 2 (two) times daily. 12/11/14   Renne Crigler, PA-C  drospirenone-ethinyl estradiol (GIANVI) 3-0.02 MG tablet Gianvi (28) 3 mg-0.02 mg tablet  TAKE 1 TABLET BY MOUTH EVERY DAY    [provider]  escitalopram (LEXAPRO) 10 MG tablet TAKE 1 &1/2 TABLETS BY MOUTH DAILY & INCREASE TO 2 TABLETS DAILY FIVE DAYS PRIOR TO MENSTRUAL CYCLE 12/06/19   Cottle, Steva Ready., MD  PROAIR HFA 108 (90 BASE) MCG/ACT inhaler Inhale 2 puffs into the lungs every 4 (four) hours as needed for wheezing.  11/22/14   [provider]  Promethazine HCl (PHENERGAN PO) Take by mouth.    [provider]  rizatriptan (MAXALT-MLT) 10 MG disintegrating tablet TAKE AS DIRECTED AS NEEDED 05/12/19   [provider]    Allergies    Effexor [venlafaxine hcl], Elavil [amitriptyline], Erythromycin, Iohexol, Latex, and Sulfa antibiotics  Review of Systems   Review of Systems  Constitutional: Negative for activity change and appetite change.  HENT: Negative for congestion and rhinorrhea.   Respiratory: Positive for chest tightness. Negative for cough and shortness of  breath.   Cardiovascular: Positive for chest pain.  Gastrointestinal: Positive for nausea. Negative for abdominal pain and vomiting.  Genitourinary: Negative for dysuria and hematuria.  Musculoskeletal: Positive for arthralgias, myalgias and neck pain.  Neurological: Positive for numbness. Negative for dizziness, weakness and headaches.   all other systems are negative except as noted in the HPI and PMH. \   Physical Exam Updated Vital Signs BP (!) 155/103 (BP Location: Right Arm)   Pulse 93   Temp 97.8 F (36.6 C) (Oral)   Resp 15   Ht 5\' 7"  (1.702 m)   Wt (!) 111.1 kg   LMP  (LMP Unknown)   SpO2 99%   BMI 38.37 kg/m   Physical Exam Vitals and nursing note reviewed.  Constitutional:      General: She is not in acute distress.    Appearance: She is well-developed.  HENT:     Head: Normocephalic and atraumatic.     Mouth/Throat:     Pharynx: No oropharyngeal exudate.  Eyes:     Conjunctiva/sclera: Conjunctivae normal.     Pupils: Pupils are equal, round, and reactive to light.  Neck:     Comments: No meningismus. Cardiovascular:     Rate and Rhythm: Normal rate and regular rhythm.     Heart sounds: Normal heart sounds. No murmur heard.   Pulmonary:     Effort: Pulmonary effort is normal. No respiratory distress.     Breath sounds: Normal breath sounds.  Chest:     Chest wall: No tenderness.  Abdominal:     Palpations: Abdomen is soft.     Tenderness: There is no abdominal tenderness. There is no guarding or rebound.  Musculoskeletal:        General: Tenderness present. Normal range of motion.     Cervical back: Normal range of motion and neck supple.     Comments: Left paraspinal cervical and trapezius tenderness.  Equal grip strength bilaterally. Equal strength with forearm flexion extension.  No pronator drift.  Equal radial pulses and grip strengths.  Skin:    General: Skin is warm.  Neurological:     Mental Status: She is alert and oriented to person,  place, and time.     Cranial Nerves: No cranial nerve deficit.     Motor: No abnormal muscle tone.  Coordination: Coordination normal.     Comments:  5/5 strength throughout. CN 2-12 intact.Equal grip strength.   Psychiatric:        Behavior: Behavior normal.     ED Results / Procedures / Treatments   Labs (all labs ordered are listed, but only abnormal results are displayed) Labs Reviewed  BASIC METABOLIC PANEL - Abnormal; Notable for the following components:      Result Value   Glucose, Bld 138 (*)    All other components within normal limits  CBC - Abnormal; Notable for the following components:   WBC 16.4 (*)    All other components within normal limits  I-STAT BETA HCG BLOOD, ED ([facility], [facility], [facility] ONLY)  TROPONIN I (HIGH SENSITIVITY)  TROPONIN I (HIGH SENSITIVITY)    EKG EKG Interpretation  Date/Time:  Friday March 03 2020 01:39:17 EDT Ventricular Rate:  90 PR Interval:    QRS Duration: 100 QT Interval:  349 QTC Calculation: 427 R Axis:   37 Text Interpretation: Sinus rhythm Low voltage, precordial leads Baseline wander in lead(s) V3 V4 V5 12 Lead; Mason-Likar No significant change was found Confirmed by Glynn Octave [phone]) on 03/03/2020 1:54:21 AM   Radiology DG Chest 2 View  Result Date: 03/03/2020 CLINICAL DATA:  Chest pain EXAM: CHEST - 2 VIEW COMPARISON:  October 08, 2019 FINDINGS: The heart size and mediastinal contours are within normal limits. Both lungs are clear. The visualized skeletal structures are unremarkable. IMPRESSION: No active cardiopulmonary disease. Electronically Signed   By: Jonna Clark M.D.   On: 03/03/2020 02:21   DG Cervical Spine Complete  Result Date: 03/03/2020 CLINICAL DATA:  Neck pain. EXAM: CERVICAL SPINE - COMPLETE 4+ VIEW COMPARISON:  Chest x-ray 03/03/2020. FINDINGS: Patient's head is angle slightly to the right. Soft tissue structures are unremarkable. Epiglottis and retropharyngeal space appear normal. Cervical airway widely  patent. Mild degenerative change cervical spine. No acute bony abnormality identified. No evidence of fracture or dislocation. Neuroforamen are widely patent. Pulmonary apices are clear. IMPRESSION: Mild degenerative change, no acute abnormality identified. Electronically Signed   By: Maisie Fus  Register   On: 03/03/2020 06:53    Procedures Procedures (including critical care time)  Medications Ordered in ED Medications - No data to display  ED Course  I have reviewed the triage vital signs and the nursing notes.  Pertinent labs & imaging results that were available during my care of the patient were reviewed by me and considered in my medical decision making (see chart for details).    MDM Rules/Calculators/A&P                          Left-sided neck and back pain that radiates down shoulder and arm.  But is not having chest pain currently.  Her EKG has no acute changes.  Low suspicion for ACS or pulmonary embolism. Her description of pain seems consistent with cervical radiculopathy.  She has no weakness on exam.  She has intact distal pulses and grip strengths.  Labs show leukocytosis, appears to be chronic. Troponin negative.   Patient concerned about her elevated blood pressure.  She is given home medication of amlodipine 5 mg.  Equal upper extremity blood pressures bilaterally.  Equal grip strength and radial pulses.  Low suspicion for ACS, PE, aortic dissection.  Patient concerned about the "blood vessels in my neck". Advised that this seems less likely but is not impossible with her history of hypertension and atraumatic neck pain.  Will obtain CT imaging to evaluate her vertebral arteries for dissection.  She is contrast allergic and will need pretreatment.  States she gets rash and hives with the dye.  States she is able to tolerate the dye with pretreatment.  Second troponin is negative.  Suspect she likely has cervical radiculopathy and will treat as such.  Care to be  transferred at shift change.  Care transferred at shift change to Dr. Pilar PlateBero. Final Clinical Impression(s) / ED Diagnoses Final diagnoses:  None    Rx / DC Orders ED Discharge Orders    None       Sarely Stracener, Jeannett SeniorStephen, MD 03/03/20 [phone]

## 2020-03-03 NOTE — ED Triage Notes (Signed)
Patient A&O x4 ambulatory. Pt. Was awaken by the chest pain. Rates pain 4 on 1-10 pain scale. Pain is radiating down left arm to finger tips. Pt. Is having nausea.

## 2020-03-03 NOTE — ED Provider Notes (Signed)
  Provider Note MRN:  [id number]  Arrival date & time: 03/03/20    ED Course and Medical Decision Making  Assumed care from Dr. Lajean Saver at shift change.  Neck pain, favoring cervical radiculopathy but will CT to exclude vertebral or carotid dissection.  Will need pretreatment given history of allergy.  CT imaging is reassuring, troponin negative, appropriate for discharge.  Procedures  Final Clinical Impressions(s) / ED Diagnoses     ICD-10-CM   1. Cervical radiculopathy  M54.12     ED Discharge Orders         Ordered    methylPREDNISolone (MEDROL DOSEPAK) 4 MG TBPK tablet     Discontinue  Reprint     03/03/20 0730    methocarbamol (ROBAXIN) 500 MG tablet  Every 8 hours PRN     Discontinue  Reprint     03/03/20 0730            Discharge Instructions     Take the steroids as prescribed.  There is no evidence of heart attack or blood clot in the lung.  You should follow-up with your doctor for an MRI of your neck.  Return to the ED sooner if you develop weakness, numbness, tingling, fever, any other concerns.     Elmer Sow. Pilar Plate, MD [facility] Emergency Medicine [facility] [email or web address]    Sabas Sous, MD 03/03/20 [phone]

## 2020-03-03 NOTE — Discharge Instructions (Signed)
Take the steroids as prescribed.  There is no evidence of heart attack or blood clot in the lung.  You should follow-up with your doctor for an MRI of your neck.  Return to the ED sooner if you develop weakness, numbness, tingling, fever, any other concerns.

## 2020-03-06 ENCOUNTER — Other Ambulatory Visit: Payer: Self-pay

## 2020-03-06 ENCOUNTER — Ambulatory Visit (INDEPENDENT_AMBULATORY_CARE_PROVIDER_SITE_OTHER): Payer: No Typology Code available for payment source | Admitting: Psychiatry

## 2020-03-06 ENCOUNTER — Encounter: Payer: Self-pay | Admitting: Psychiatry

## 2020-03-06 DIAGNOSIS — F331 Major depressive disorder, recurrent, moderate: Secondary | ICD-10-CM

## 2020-03-06 DIAGNOSIS — F3281 Premenstrual dysphoric disorder: Secondary | ICD-10-CM

## 2020-03-06 DIAGNOSIS — F4310 Post-traumatic stress disorder, unspecified: Secondary | ICD-10-CM

## 2020-03-06 DIAGNOSIS — F411 Generalized anxiety disorder: Secondary | ICD-10-CM | POA: Diagnosis not present

## 2020-03-06 DIAGNOSIS — F5105 Insomnia due to other mental disorder: Secondary | ICD-10-CM

## 2020-03-06 DIAGNOSIS — R5382 Chronic fatigue, unspecified: Secondary | ICD-10-CM
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
## 2020-05-02 ENCOUNTER — Other Ambulatory Visit: Payer: Self-pay | Admitting: Psychiatry

## 2020-05-02 DIAGNOSIS — F331 Major depressive disorder, recurrent, moderate: Secondary | ICD-10-CM

## 2020-05-03 NOTE — Telephone Encounter (Signed)
review 

## 2020-05-03 NOTE — Telephone Encounter (Signed)
Last note states she was taking wellbutrin SR 100 mg.

## 2020-05-28 ENCOUNTER — Other Ambulatory Visit: Payer: Self-pay | Admitting: Psychiatry

## 2020-05-28 DIAGNOSIS — F331 Major depressive disorder, recurrent, moderate: Secondary | ICD-10-CM

## 2020-07-06 ENCOUNTER — Telehealth (INDEPENDENT_AMBULATORY_CARE_PROVIDER_SITE_OTHER): Payer: No Typology Code available for payment source | Admitting: Psychiatry

## 2020-07-06 ENCOUNTER — Encounter: Payer: Self-pay | Admitting: Psychiatry

## 2020-07-06 DIAGNOSIS — F4310 Post-traumatic stress disorder, unspecified: Secondary | ICD-10-CM | POA: Diagnosis not present

## 2020-07-06 DIAGNOSIS — F331 Major depressive disorder, recurrent, moderate: Secondary | ICD-10-CM

## 2020-07-06 DIAGNOSIS — F411 Generalized anxiety disorder: Secondary | ICD-10-CM

## 2020-07-06 DIAGNOSIS — F3281 Premenstrual dysphoric disorder: Secondary | ICD-10-CM | POA: Diagnosis not present

## 2020-07-06 DIAGNOSIS — F5105 Insomnia due to other mental disorder: Secondary | ICD-10-CM

## 2020-07-06 MED ORDER — BUPROPION HCL ER (SR) 100 MG PO TB12: 100.0000 mg | ORAL_TABLET | Freq: Two times a day (BID) | ORAL | 1 refills | Status: DC

## 2020-07-06 MED ORDER — ALPRAZOLAM 0.5 MG PO TABS: 0.5000 mg | ORAL_TABLET | Freq: Every day | ORAL | 1 refills | Status: DC | PRN

## 2020-07-06 MED ORDER — ESCITALOPRAM OXALATE 10 MG PO TABS: 15.0000 mg | ORAL_TABLET | ORAL | 1 refills | Status: DC
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
## 2021-01-09 ENCOUNTER — Other Ambulatory Visit: Payer: Self-pay | Admitting: Psychiatry

## 2021-01-09 DIAGNOSIS — F331 Major depressive disorder, recurrent, moderate: Secondary | ICD-10-CM

## 2021-01-09 DIAGNOSIS — F3281 Premenstrual dysphoric disorder: Secondary | ICD-10-CM

## 2021-01-09 DIAGNOSIS — F4310 Post-traumatic stress disorder, unspecified: Secondary | ICD-10-CM

## 2021-01-09 NOTE — Telephone Encounter (Signed)
Call to RS

## 2021-01-12 NOTE — Telephone Encounter (Signed)
Called Pt to schedule follow up. Left message on voice mail.

## 2021-02-11 ENCOUNTER — Other Ambulatory Visit: Payer: Self-pay | Admitting: Psychiatry

## 2021-02-11 DIAGNOSIS — F4310 Post-traumatic stress disorder, unspecified: Secondary | ICD-10-CM

## 2021-02-11 DIAGNOSIS — F3281 Premenstrual dysphoric disorder: Secondary | ICD-10-CM

## 2021-02-11 DIAGNOSIS — F331 Major depressive disorder, recurrent, moderate: Secondary | ICD-10-CM

## 2021-02-13 NOTE — Telephone Encounter (Signed)
Please schedule appt

## 2021-02-15 NOTE — Telephone Encounter (Signed)
Pt has an appt on 8/11

## 2021-03-15 ENCOUNTER — Ambulatory Visit (INDEPENDENT_AMBULATORY_CARE_PROVIDER_SITE_OTHER): Payer: No Typology Code available for payment source | Admitting: Psychiatry

## 2021-03-15 ENCOUNTER — Other Ambulatory Visit: Payer: Self-pay

## 2021-03-15 ENCOUNTER — Encounter: Payer: Self-pay | Admitting: Psychiatry

## 2021-03-15 DIAGNOSIS — R5382 Chronic fatigue, unspecified: Secondary | ICD-10-CM

## 2021-03-15 DIAGNOSIS — F4310 Post-traumatic stress disorder, unspecified: Secondary | ICD-10-CM | POA: Diagnosis not present

## 2021-03-15 DIAGNOSIS — F5105 Insomnia due to other mental disorder: Secondary | ICD-10-CM

## 2021-03-15 DIAGNOSIS — F411 Generalized anxiety disorder: Secondary | ICD-10-CM | POA: Diagnosis not present

## 2021-03-15 DIAGNOSIS — F3281 Premenstrual dysphoric disorder: Secondary | ICD-10-CM

## 2021-03-15 DIAGNOSIS — F331 Major depressive disorder, recurrent, moderate: Secondary | ICD-10-CM | POA: Diagnosis not present

## 2021-03-15 MED ORDER — ALPRAZOLAM 0.5 MG PO TABS: 0.5000 mg | ORAL_TABLET | Freq: Every day | ORAL | 1 refills | Status: DC | PRN

## 2021-03-15 MED ORDER — ESCITALOPRAM OXALATE 10 MG PO TABS: 15.0000 mg | ORAL_TABLET | Freq: Every day | ORAL | 3 refills | Status: DC
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
## 2021-05-16 ENCOUNTER — Other Ambulatory Visit: Payer: Self-pay | Admitting: Psychiatry

## 2021-05-16 DIAGNOSIS — F331 Major depressive disorder, recurrent, moderate: Secondary | ICD-10-CM

## 2021-05-16 DIAGNOSIS — F4310 Post-traumatic stress disorder, unspecified: Secondary | ICD-10-CM

## 2021-05-16 DIAGNOSIS — F3281 Premenstrual dysphoric disorder: Secondary | ICD-10-CM

## 2021-05-28 ENCOUNTER — Telehealth: Payer: Self-pay | Admitting: Psychiatry

## 2021-05-28 NOTE — Telephone Encounter (Signed)
Pt is pregnant! Approximately 8 weeks now. Will go for first ultrasound this Friday 10/28.

## 2021-05-29 NOTE — Telephone Encounter (Signed)
Congratulations on her pregnancy.  We had discussed that in relation to her medications and adjusted meds to the lowest effective dose with the least number of medications and the use of medications that are considered relatively safe in pregnancy.

## 2021-06-01 LAB — OB RESULTS CONSOLE HEPATITIS B SURFACE ANTIGEN: Hepatitis B Surface Ag: NEGATIVE

## 2021-06-01 LAB — OB RESULTS CONSOLE ABO/RH: RH Type: NEGATIVE

## 2021-06-01 LAB — OB RESULTS CONSOLE RPR: RPR: NONREACTIVE

## 2021-06-01 LAB — OB RESULTS CONSOLE HIV ANTIBODY (ROUTINE TESTING): HIV: NONREACTIVE

## 2021-06-01 LAB — OB RESULTS CONSOLE RUBELLA ANTIBODY, IGM: Rubella: IMMUNE

## 2021-06-01 LAB — OB RESULTS CONSOLE ANTIBODY SCREEN: Antibody Screen: NEGATIVE

## 2021-06-01 LAB — HEPATITIS C ANTIBODY: HCV Ab: NEGATIVE

## 2021-06-13 LAB — OB RESULTS CONSOLE GC/CHLAMYDIA
Chlamydia: NEGATIVE
Gonorrhea: NEGATIVE
Neisseria Gonorrhea: NEGATIVE

## 2021-06-19 IMAGING — CR DG CHEST 2V
2 series · 2 of 2 positions shown · non-contrast
Comparison: October 08, 2019

CLINICAL DATA: Chest pain

EXAM:
CHEST - 2 VIEW

[w chest pa]
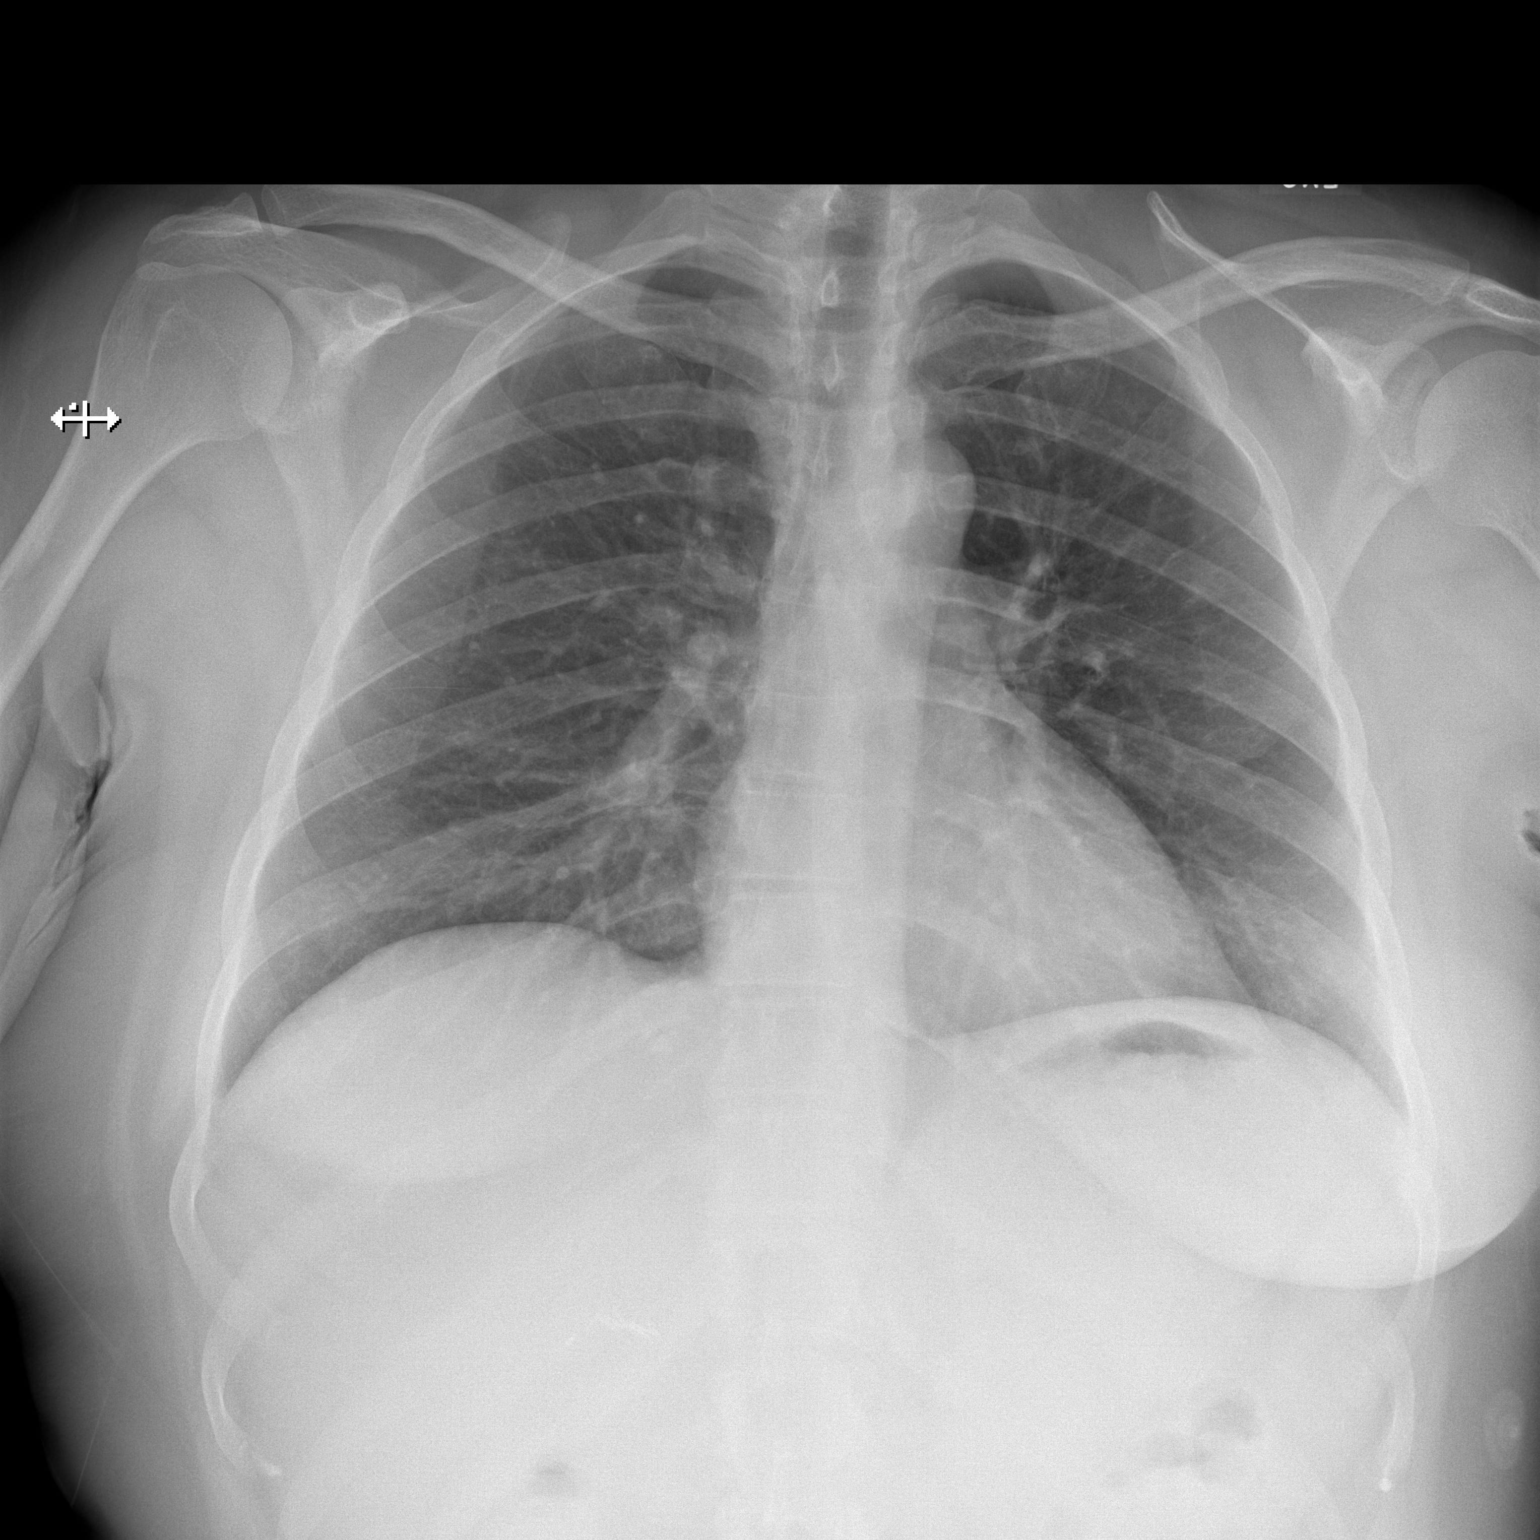

[w chest lat]
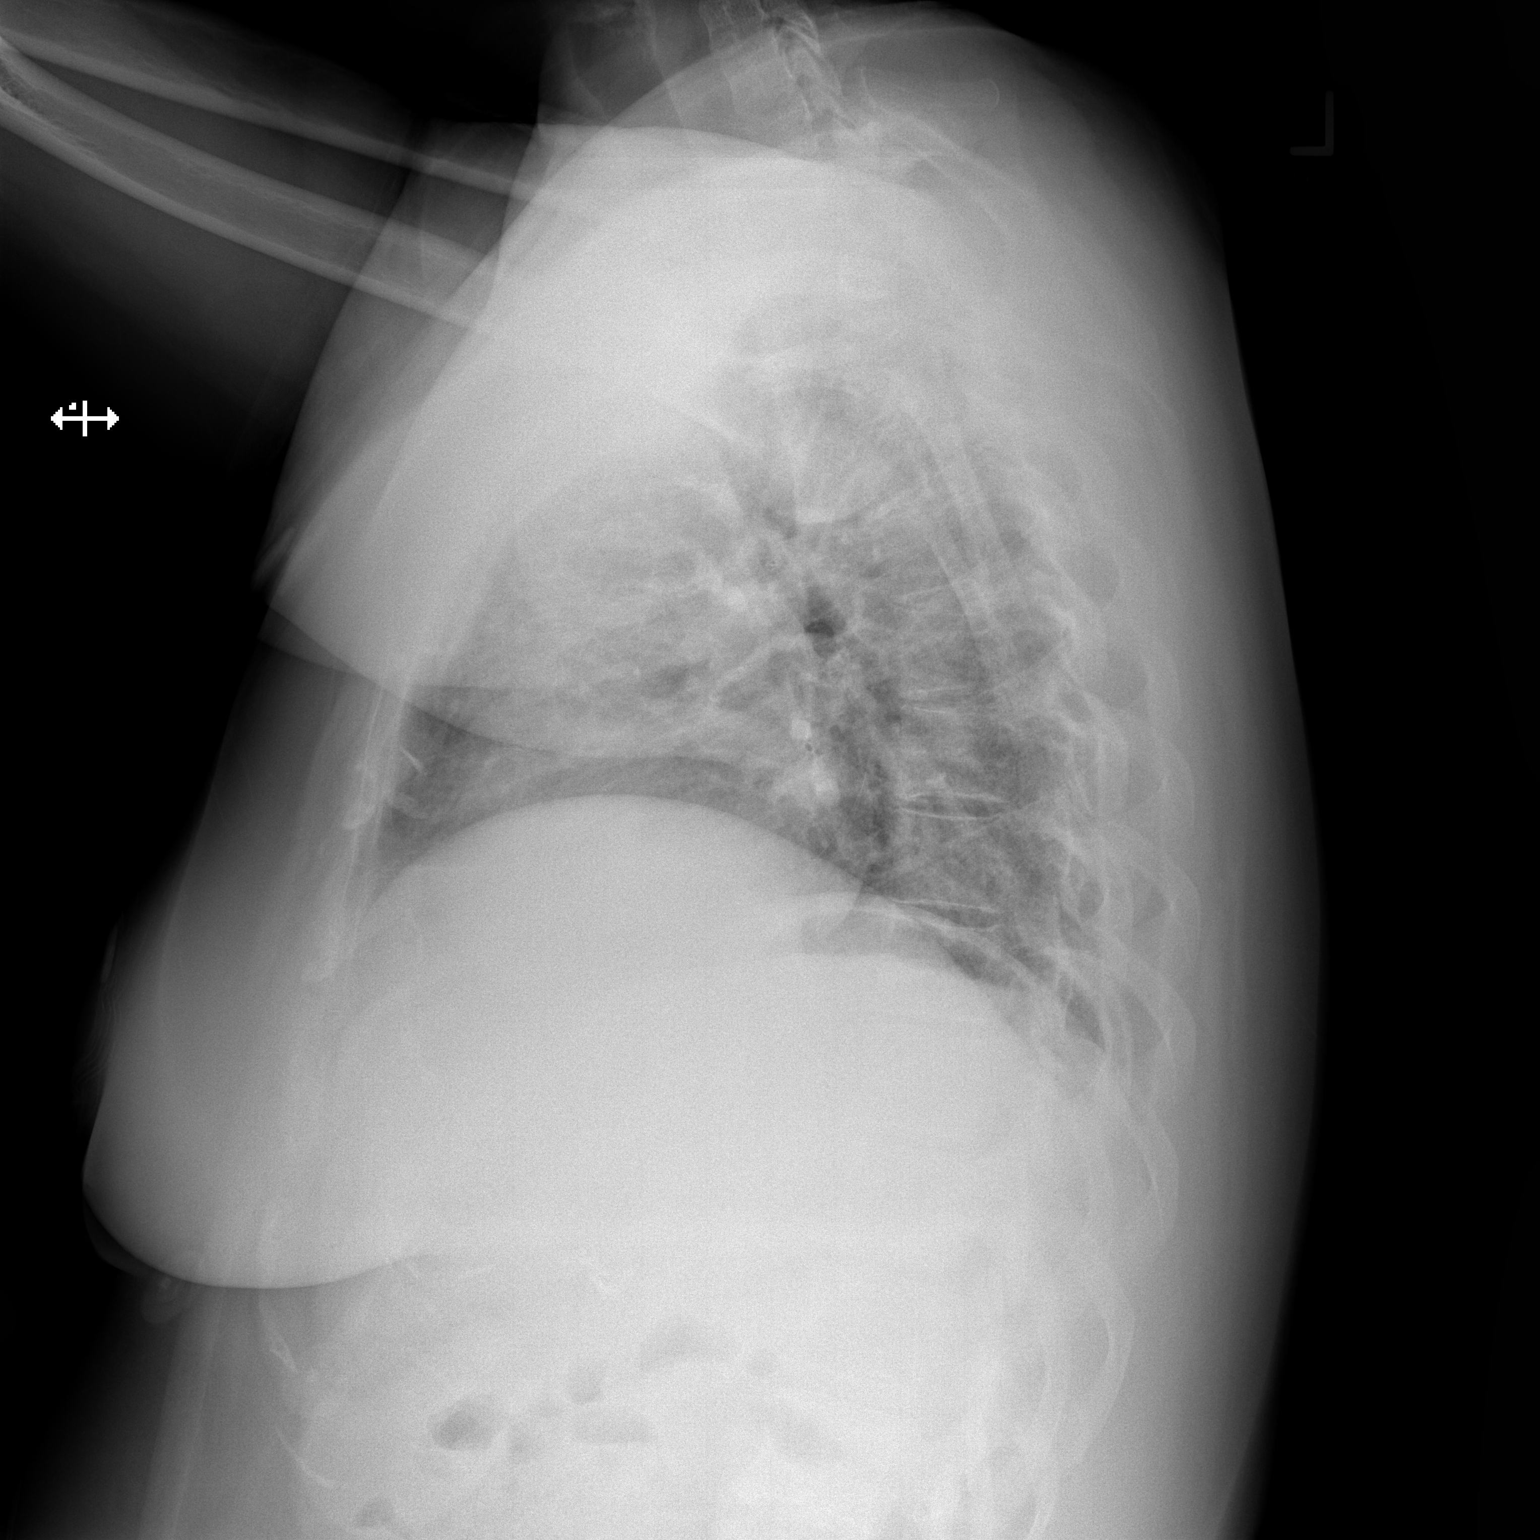

[2 of 2 positions shown; findings below may reference images not displayed]

FINDINGS: The heart size and mediastinal contours are within normal limits.
Both lungs are clear. The visualized skeletal structures are
unremarkable.
IMPRESSION: No active cardiopulmonary disease.

## 2021-06-19 IMAGING — CR DG CERVICAL SPINE COMPLETE 4+V
7 series · 7 of 7 positions shown · non-contrast
Comparison: Chest x-ray 03/03/2020.

CLINICAL DATA: Neck pain.

EXAM:
CERVICAL SPINE - COMPLETE 4+ VIEW

[w cervical spine lat]
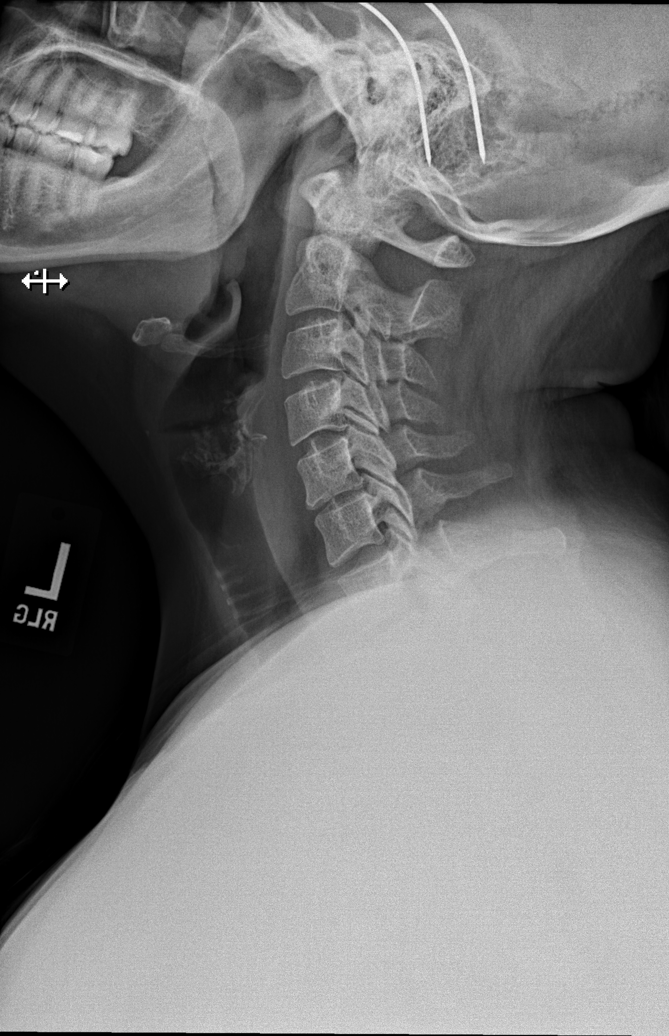

[w cervical spine ap_obl (1 of 2)]
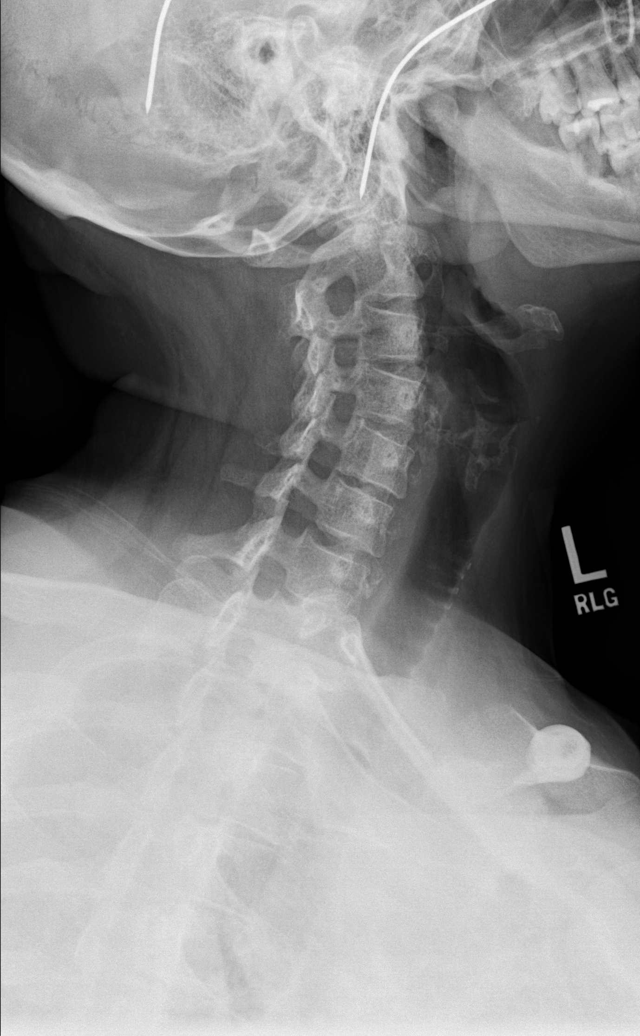

[w cervical spine ap_obl (2 of 2)]
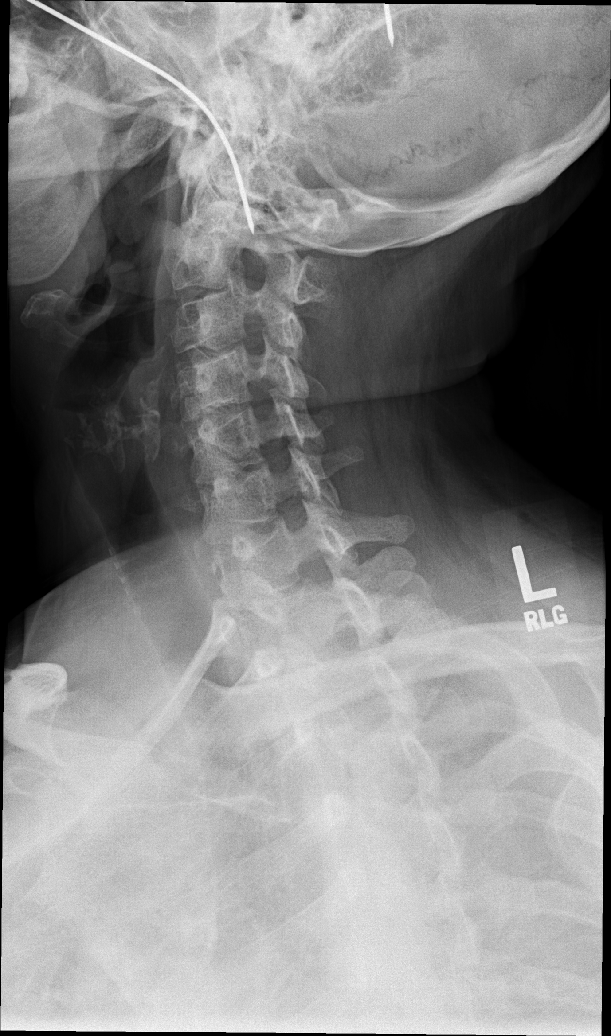

[w cervical spine ap]
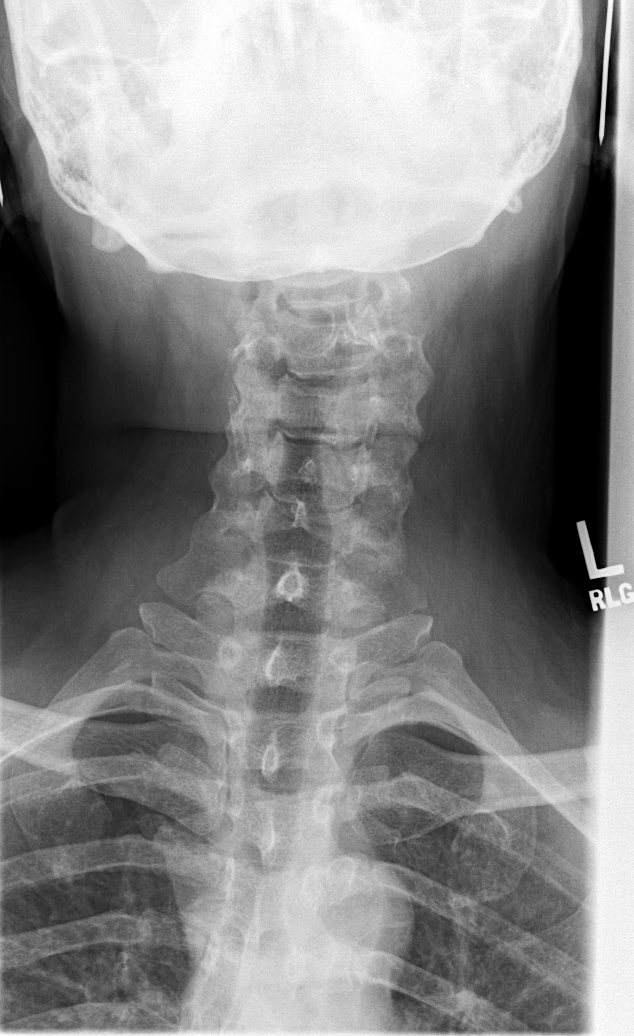

[w cervical spine odontoid (1 of 2)]
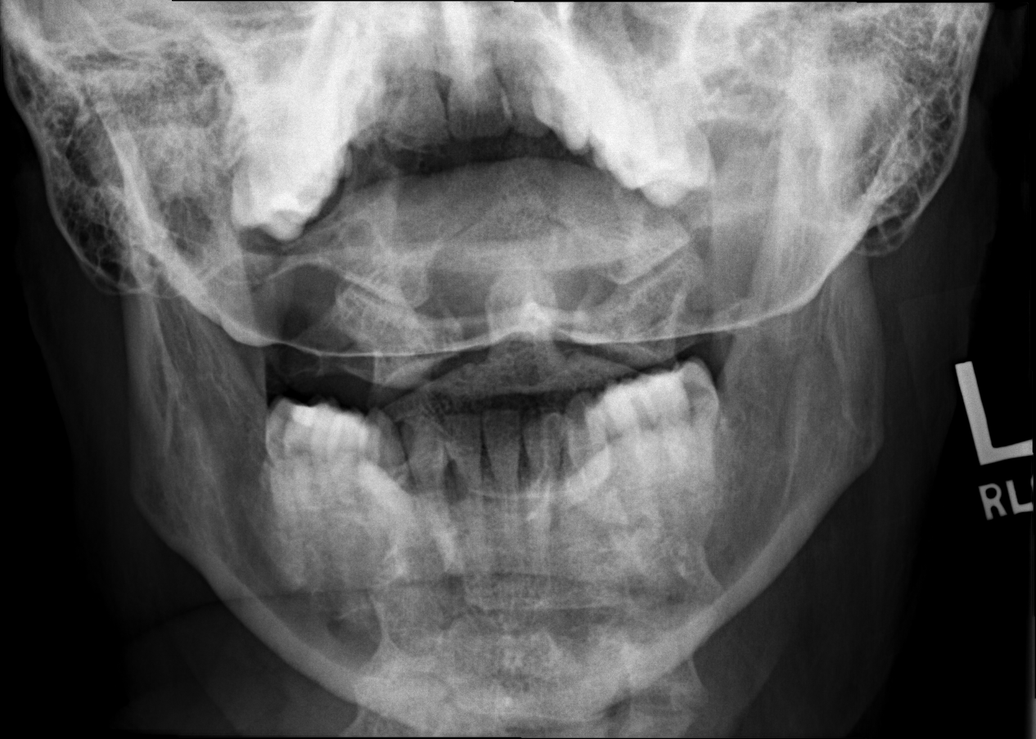

[w cervical spine odontoid (2 of 2)]
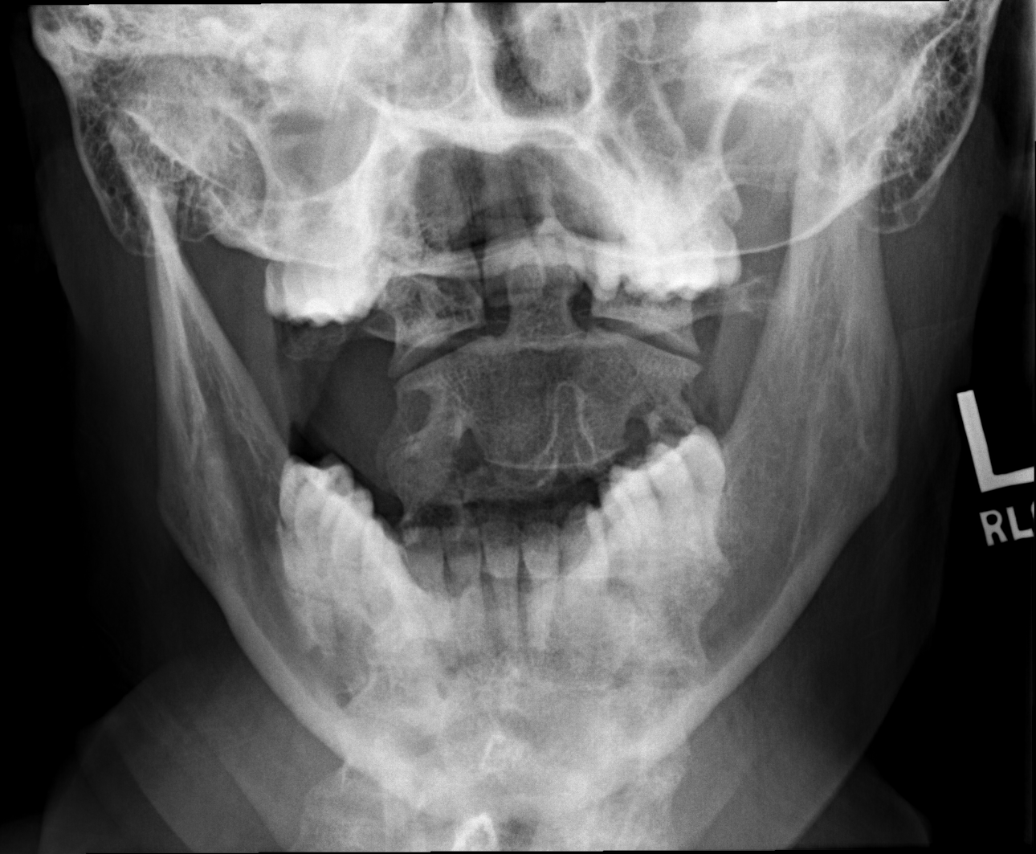

[w cervical swimmers]
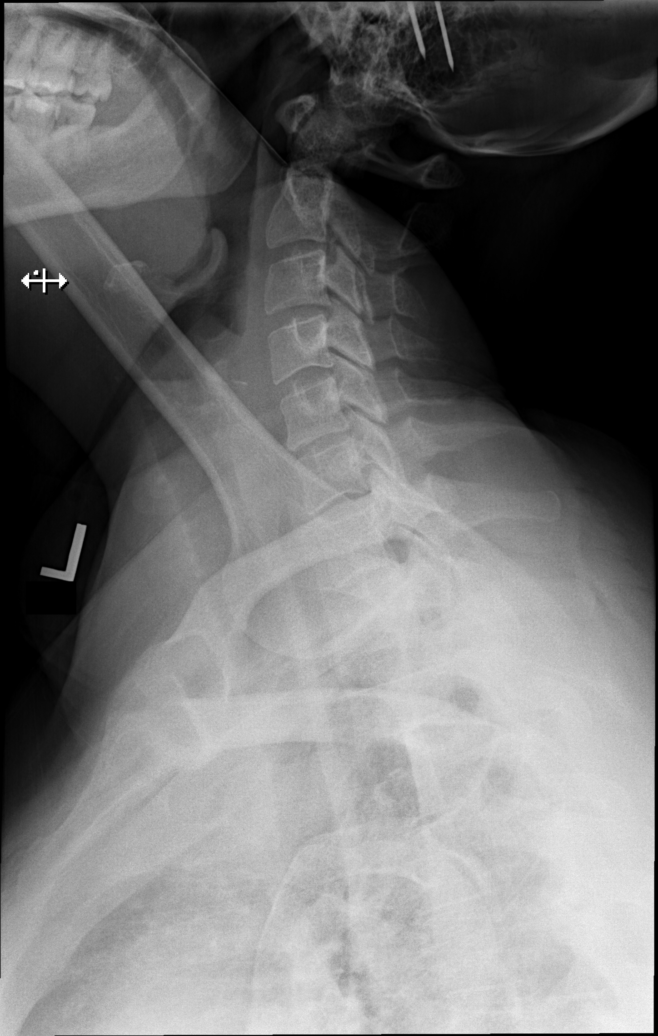

[7 of 7 positions shown; findings below may reference images not displayed]

FINDINGS: Patient's head is angle slightly to the right. Soft tissue
structures are unremarkable. Epiglottis and retropharyngeal space
appear normal. Cervical airway widely patent. Mild degenerative
change cervical spine. No acute bony abnormality identified. No
evidence of fracture or dislocation. Neuroforamen are widely patent.
Pulmonary apices are clear.
IMPRESSION: Mild degenerative change, no acute abnormality identified.

## 2021-08-05 NOTE — L&D Delivery Note (Signed)
Delivery Note Labor onset: 01/03/2022  Labor Onset Time: 1300 Complete dilation at 8:24 PM  Onset of pushing at 2030 FHR second stage Cat 1 Analgesia/Anesthesia intrapartum: Epidural  Guided pushing with maternal urge. Delivery of a viable female at 2110. Fetal head delivered in LOA position.  Nuchal cord: x1, unable to reduce, delivered via somersault maneuver.  Infant placed on maternal abd, dried, and tactile stim. Infant with mec stained fluid and membranes. Spontaneous cry with good tine at birth. Loss of tone and decrease in HR and respiratory efforts. Infant to warmer at 2 min old, Code Apgar, see NICU note for details. Infant returned to mother for skin-to-skin. Infant remained with mother and attempting to breastfeed when CNM left the room.   Cord double clamped after 2 min and cut by father, [place] [facility].  RN x2 present for birth.  Cord blood sample collected: Yes Arterial cord blood sample collected: No  Shortened cord avulsed. Manual removal of placenta, 3 VC. Dr. Drema Balzarine notified.   Placenta to pathology. Uterine tone firm, bleeding small to moderate Prophylactic TXA   2nd laceration identified.  Anesthesia: epidural Repair: 4-0 Vicryl CT, 2-0 Monocryl SH QBL/EBL (mL): 400 Complications: secondary apnea in the newborn and manual removal of the placenta. Pt to start prophylactic Unasyn 3G IV Q6 hrs x 24 hours.   APGAR: APGAR (1 MIN): 6   APGAR (5 MINS): 5   APGAR (10 MINS): 9   Mom to postpartum.  Baby to Couplet care / Skin to Skin Parents request in-patient circumcision  Roma Schanz MSN, CNM 01/03/2022, 10:34 PM

## 2021-09-07 ENCOUNTER — Other Ambulatory Visit: Payer: Self-pay | Admitting: Obstetrics and Gynecology

## 2021-09-07 DIAGNOSIS — Z3689 Encounter for other specified antenatal screening: Secondary | ICD-10-CM

## 2021-09-11 ENCOUNTER — Other Ambulatory Visit: Payer: Self-pay

## 2021-09-20 ENCOUNTER — Encounter: Payer: Self-pay | Admitting: *Deleted

## 2021-09-25 ENCOUNTER — Other Ambulatory Visit: Payer: No Typology Code available for payment source

## 2021-09-25 ENCOUNTER — Ambulatory Visit: Payer: No Typology Code available for payment source

## 2021-09-26 ENCOUNTER — Other Ambulatory Visit: Payer: Self-pay

## 2021-09-26 ENCOUNTER — Ambulatory Visit: Payer: No Typology Code available for payment source | Admitting: *Deleted

## 2021-09-26 ENCOUNTER — Ambulatory Visit (HOSPITAL_BASED_OUTPATIENT_CLINIC_OR_DEPARTMENT_OTHER): Payer: No Typology Code available for payment source | Admitting: Obstetrics

## 2021-09-26 ENCOUNTER — Encounter: Payer: Self-pay | Admitting: *Deleted

## 2021-09-26 ENCOUNTER — Ambulatory Visit: Payer: No Typology Code available for payment source | Attending: Obstetrics and Gynecology

## 2021-09-26 DIAGNOSIS — Z3A25 25 weeks gestation of pregnancy: Secondary | ICD-10-CM | POA: Insufficient documentation

## 2021-09-26 DIAGNOSIS — O10912 Unspecified pre-existing hypertension complicating pregnancy, second trimester: Secondary | ICD-10-CM | POA: Diagnosis present

## 2021-09-26 DIAGNOSIS — O09522 Supervision of elderly multigravida, second trimester: Secondary | ICD-10-CM | POA: Insufficient documentation

## 2021-09-26 DIAGNOSIS — O99212 Obesity complicating pregnancy, second trimester: Secondary | ICD-10-CM | POA: Insufficient documentation

## 2021-09-26 DIAGNOSIS — Z3689 Encounter for other specified antenatal screening: Secondary | ICD-10-CM | POA: Insufficient documentation

## 2021-09-26 NOTE — Progress Notes (Signed)
MFM Note  [name] was seen for a detailed fetal anatomy scan due to advanced maternal age (41 years old), maternal obesity with a BMI of 38, and chronic hypertension treated with labetalol 50 mg twice a day.  She denies any other significant past medical history and denies any problems in her current pregnancy.    She declined all screening tests for fetal aneuploidy in her current pregnancy.  She was informed that the fetal growth and amniotic fluid level were appropriate for her gestational age.   There were no obvious fetal anomalies noted on today's ultrasound exam.  However, today's exam was limited due to maternal body habitus.  The patient was informed that anomalies may be missed due to technical limitations. If the fetus is in a suboptimal position or maternal habitus is increased, visualization of the fetus in the maternal uterus may be impaired.  The following were discussed during our consultation today:  Chronic hypertension in pregnancy  The implications and management of chronic hypertension in pregnancy was discussed. The patient was advised that should her blood pressures be elevated later in pregnancy, the dosage of labetalol may have to be increased to help her achieve better blood pressure control.    The increased risk of superimposed preeclampsia, an indicated preterm delivery, and possible fetal growth restriction due to chronic hypertension in pregnancy was discussed. The patient was advised that we will continue to follow her closely throughout her pregnancy.   To decrease her risk of superimposed preeclampsia, she should continue taking a daily baby aspirin (81 mg daily) for preeclampsia prophylaxis.   We will continue to follow her with monthly growth ultrasounds .   Weekly fetal testing should be started at 32 weeks.  Delivery for chronic hypertension is recommended at between 37 to 39 weeks depending on her blood pressure control.  Advanced  maternal age in pregnancy  The increased risk of fetal aneuploidy due to advanced maternal age was discussed. Due to advanced maternal age, the patient was offered and declined an amniocentesis today for definitive diagnosis of fetal aneuploidy.  She was also offered and declined a cell free DNA test today.  Obesity in pregnancy  Due to obesity, she should be screened for diabetes at between 24 to 28 weeks.  As maternal obesity may present challenges associated with the management of anesthesia, an anesthesia consult should be obtained when she is admitted in labor.  A follow-up growth scan was scheduled in 4 weeks.  The patient stated that all of her questions have been answered to her satisfaction.    We will continue to follow her closely with you throughout her pregnancy.  A total of 30 minutes was spent counseling and coordinating the care for this patient.  Greater than 50% of the time was spent in direct face-to-face contact.

## 2021-09-27 ENCOUNTER — Other Ambulatory Visit: Payer: Self-pay | Admitting: *Deleted

## 2021-09-27 DIAGNOSIS — Z6841 Body Mass Index (BMI) 40.0 and over, adult: Secondary | ICD-10-CM

## 2021-09-27 DIAGNOSIS — O09522 Supervision of elderly multigravida, second trimester: Secondary | ICD-10-CM

## 2021-10-22 ENCOUNTER — Ambulatory Visit: Payer: No Typology Code available for payment source | Admitting: *Deleted

## 2021-10-22 ENCOUNTER — Encounter: Payer: Self-pay | Admitting: *Deleted

## 2021-10-22 ENCOUNTER — Other Ambulatory Visit: Payer: Self-pay

## 2021-10-22 ENCOUNTER — Ambulatory Visit: Payer: No Typology Code available for payment source | Attending: Obstetrics

## 2021-10-22 VITALS — BP 133/75 | HR 83

## 2021-10-22 DIAGNOSIS — Z3A28 28 weeks gestation of pregnancy: Secondary | ICD-10-CM | POA: Diagnosis not present

## 2021-10-22 DIAGNOSIS — O09522 Supervision of elderly multigravida, second trimester: Secondary | ICD-10-CM | POA: Diagnosis present

## 2021-10-22 DIAGNOSIS — O99213 Obesity complicating pregnancy, third trimester: Secondary | ICD-10-CM | POA: Diagnosis not present

## 2021-10-22 DIAGNOSIS — O10013 Pre-existing essential hypertension complicating pregnancy, third trimester: Secondary | ICD-10-CM

## 2021-10-22 DIAGNOSIS — O09523 Supervision of elderly multigravida, third trimester: Secondary | ICD-10-CM

## 2021-10-22 DIAGNOSIS — Z362 Encounter for other antenatal screening follow-up: Secondary | ICD-10-CM

## 2021-10-22 DIAGNOSIS — Z6841 Body Mass Index (BMI) 40.0 and over, adult: Secondary | ICD-10-CM | POA: Insufficient documentation

## 2021-10-22 DIAGNOSIS — I10 Essential (primary) hypertension: Secondary | ICD-10-CM

## 2021-10-23 ENCOUNTER — Other Ambulatory Visit: Payer: Self-pay | Admitting: *Deleted

## 2021-10-23 DIAGNOSIS — O99213 Obesity complicating pregnancy, third trimester: Secondary | ICD-10-CM

## 2021-10-23 DIAGNOSIS — O10913 Unspecified pre-existing hypertension complicating pregnancy, third trimester: Secondary | ICD-10-CM

## 2021-10-23 DIAGNOSIS — O09523 Supervision of elderly multigravida, third trimester: Secondary | ICD-10-CM

## 2021-10-24 ENCOUNTER — Ambulatory Visit: Payer: No Typology Code available for payment source

## 2021-11-20 ENCOUNTER — Ambulatory Visit: Payer: No Typology Code available for payment source

## 2021-12-11 ENCOUNTER — Encounter: Payer: Self-pay | Admitting: Psychiatry

## 2021-12-11 ENCOUNTER — Ambulatory Visit (INDEPENDENT_AMBULATORY_CARE_PROVIDER_SITE_OTHER): Payer: No Typology Code available for payment source | Admitting: Psychiatry

## 2021-12-11 DIAGNOSIS — R5382 Chronic fatigue, unspecified: Secondary | ICD-10-CM | POA: Diagnosis not present

## 2021-12-11 DIAGNOSIS — F411 Generalized anxiety disorder: Secondary | ICD-10-CM

## 2021-12-11 DIAGNOSIS — F331 Major depressive disorder, recurrent, moderate: Secondary | ICD-10-CM

## 2021-12-11 DIAGNOSIS — F4310 Post-traumatic stress disorder, unspecified: Secondary | ICD-10-CM | POA: Diagnosis not present

## 2021-12-11 DIAGNOSIS — F3281 Premenstrual dysphoric disorder: Secondary | ICD-10-CM

## 2021-12-11 DIAGNOSIS — F5105 Insomnia due to other mental disorder: Secondary | ICD-10-CM

## 2021-12-11 MED ORDER — ESCITALOPRAM OXALATE 10 MG PO TABS
20.0000 mg | ORAL_TABLET | Freq: Every day | ORAL | 0 refills | Status: DC
Start: 2021-12-11 — End: 2022-03-07

## 2021-12-11 NOTE — Progress Notes (Signed)
Marissa HaileyElizabeth D Hoffman ?[id number] ?1980-09-23 ?41 y.o. ? ? ?Subjective:  ? ?Patient ID:  [name] is a 41 y.o. (DOB 1980-09-23) female. ? ?Chief Complaint:  ?Chief Complaint  ?Patient presents with  ? Follow-up  ? ? ?HPI ?[name] presents for follow-up of depression, anxiety, and PMDD ? ?Patient last seen by May 2020.  Her depression was under control but her anxiety and PMDD were not.  It was suggested she increase Lexapro to 15 mg daily and then take 20 mg premenstrually 5 days/month. ? ?seen November 2020.  The following was noted ?Hard time remembering to increase Lexapro before cycle.  Increase in Lexapro to 15 mg daily helped somewhat anxiety.  No SE. ?Still disconnected and moody with PMDD and takes BCP to help without any benefit.  Not sure what to do. ?Average anxiety manageable.  Xanax often used 2 times weekly mostly for sleep bc getting anxious before bedtime with mind racing.   ?The plan was to increase Lexapro to 20 mg 5 days premenstrually to manage PMDD and to continue her routine dosing. ? ?Dec 06, 2019 appointment, the following is noted: ?Challenges at work.  Supervising difficulty being in the middle.  Friends wife died of cancer this weekend.   ?Overall OK.  Challenges with family with brother living there but moving out.  Dog with injuries recovering. ?Benefit with increase Lexapro to 20 mg for PMDD helped calm er. ?Taking Xanax 0.25-0.5 mg HS for sleep and occ 3 days week for anxiety. ?Plan no med changes. ? ?03/06/20 appt with the following noted: ?In ER Friday  With neck pain and high blood pressure.  Dx cervical radiulopathy and given prednisone dose pack. ?Takes lexapro 15 mg daily except 20 mg for PMDD. ?Change BCP too early to tell difference in effect for PMDD. ?More migraines. ?Taking Xanax more last week for sleep but usually about once weekly.  Last week stressful work week. ?Not noticed much difference with increase Wellbutrin to SR 100 BID in fatigue but tolerated  it well. ?Tolerating meds. ?Plan: She will try to remember to increase Lexapro to 20 mg 5 days premenstrually to manage PMDD.  PMDD calls was explained in detail including the relationship between estrogen levels dropping and serotonin levels in the brain. ? ?07/06/2020 appt with following noted: ?Xanax 1-2 weekly. ?Did try extra Lexapro and it did help with PMDD. ?Stayed on Lexapro 15 daily and Wellbutrin Sr 100 BID. ?Asked questions about possibly pursuing pregnancy and relation to psych meds. ?No kids.   ?Takes Wellbutrin for history CFS. ?Continue Lexapro to 20 mg 5 days premenstrually to manage PMDD. ? ?03/15/2021 appointment with the following noted: ?Stopped Wellbutrin DT goal of pregnancy.  No problems or differences off of it.  Unchanged chronic tiredness. ?Still on Lexapro 10 and then 15 with PMS. ?Anxiety a little up situationally with work stress. ?Still pursuing pregnancy.  Wants to use Xanax infrequently in pregnancy like once weekly. 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   ?Overall things are fine.   Working from home still.  Patient reports stable mood and denies depressed or irritable moods.  Anxiety over some work issues ongoing and perhaps worse.  Patient reports sleep variable usuallly related to work stress.  Avg 8 hours.   Denies appetite disturbance.  Patient reports that energy and motivation not good outside work on week days.  Patient denies any difficulty with concentration.  Patient denies any suicidal ideation. ?Plan: No differences off Wellbutrin.  Dropped Lexapro to 10 daily with 15 at PMDD and done oK. ? ?12/11/21 appt noted: ?Taking Lexapro 15 mg daily. ?Prgnancy going welll.  [facility] 01/09/22.  Baby doing well. ?Notices over last few weeks anxiety up after bad experience with a specialist. ?High risk DT age, weight, & BP.  Anixety higher and more on edge the last couple of mos. ?One panic episode last  week.  In tears when H came home.  Also more irritable without reason. ?SE reduced libido longterm. ? 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 ? ?Past Psychiatric Medication Trials: Fluoxetine, sertraline, Wellbutrin HA with 300 mg AM, Lexapro, amitriptyline caused suicidal thoughts, venlafaxine, Trintellix, citalopram, poor paroxetine, Cytomel, ?mirtazapine, Xanax,  Lunesta, buspirone more moody. ?modafinil,  ?Buspirone trial failed due to side effects and she stopped it. ?lithium,  naltrexone,  ?Does not remember response to most of those meds. ? ?remote history of anorexia at age 21-14 ? ?Review of Systems:  ?Review of Systems  ?Musculoskeletal:  Negative for neck pain.  ?Neurological:  Negative for tremors and weakness.  ?Psychiatric/Behavioral:  The patient is nervous/anxious.   ? ?Medications: I have reviewed the patient's current medications. ? ?Current Outpatient Medications  ?Medication Sig Dispense Refill  ? aspirin EC 81 MG tablet Take 81 mg by mouth daily. Swallow whole.    ? cyclobenzaprine (FLEXERIL) 5 MG tablet Take 5-10 mg by mouth daily as needed (menstrual cramps).     ? labetalol (NORMODYNE) 100 MG tablet Take 100 mg by mouth daily.    ? magnesium oxide (MAG-OX) 400 MG tablet Take 400 mg by mouth daily.    ? Prenatal Vit-Fe Fumarate-FA (MULTIVITAMIN-PRENATAL) 27-0.8 MG TABS tablet Take 1 tablet by mouth daily at 12 noon.    ? PROAIR HFA 108 (90 BASE) MCG/ACT inhaler Inhale 2 puffs into the lungs every 4 (four) hours as needed for wheezing.   5  ? escitalopram (LEXAPRO) 10 MG tablet Take 2 tablets (20 mg total) by mouth daily. 90 tablet 0  ? ?No current facility-administered medications for this visit.  ? ? ?Medication Side Effects: None ? ?Allergies:  ?Allergies  ?Allergen Reactions  ? Effexor [Venlafaxine Hcl] Other (See Comments)  ?  Pt states make her feel bad  ? Elavil [Amitriptyline] Other (See Comments)  ?  Suicidal effect  ? Erythromycin Other (See Comments)  ?  Stomach upset  ?  Iohexol   ?   Desc: omnipaque-x-ray dye,hives-itching;--needs premeds-benadryl prior to scan ?  ? Latex Hives  ? Sulfa Antibiotics Other (See Comments)  ?  Anaphlactic  ? ? ?Past Medical History:  ?Diagnosis Date  ? Abnormal Pap smear   ? cin-1  ? Asthma   ? Depression   ? Hx of migraines   ? Hypertension   ? PRE   ? IBS (irritable bowel syndrome)   ? ? ?Family History  ?Problem Relation Age of Onset  ? Cancer Father   ?     RENAL CELL CARCINOMA  ? Hyperlipidemia Mother   ? Thyroid disease Mother   ? Thyroid disease Maternal Grandmother   ? Kidney disease Maternal Grandmother   ? Heart disease Maternal Grandmother   ?     A FIB  ? Stroke Paternal Grandmother   ? Diabetes Paternal Grandmother   ? Hypertension Brother   ? ? ?Social History  ? ?Socioeconomic History  ? Marital status: Married  ?  Spouse name: Not on  file  ? Number of children: Not on file  ? Years of education: Not on file  ? Highest education level: Not on file  ?Occupational History  ? Not on file  ?Tobacco Use  ? Smoking status: Never  ? Smokeless tobacco: Never  ?Vaping Use  ? Vaping Use: Never used  ?Substance and Sexual Activity  ? Alcohol use: Not Currently  ?  Comment: 3-4 beers per week  ? Drug use: No  ? Sexual activity: Yes  ?  Birth control/protection: None  ?Other Topics Concern  ? Not on file  ?Social History Narrative  ? Not on file  ? ?Social Determinants of Health  ? ?Financial Resource Strain: Not on file  ?Food Insecurity: Not on file  ?Transportation Needs: Not on file  ?Physical Activity: Not on file  ?Stress: Not on file  ?Social Connections: Not on file  ?Intimate Partner Violence: Not on file  ? ? ?Past Medical History, Surgical history, Social history, and Family history were reviewed and updated as appropriate.  ? ?Please see review of systems for further details on the patient's review from today.  ? ?Objective:  ? ?Physical Exam:  ?LMP 03/29/2021  ? ?Physical Exam ?Constitutional:   ?   General: She is not in acute  distress. ?Musculoskeletal:     ?   General: No deformity.  ?Neurological:  ?   Mental Status: She is alert and oriented to person, place, and time.  ?   Cranial Nerves: No dysarthria.  ?   Coordination: Co

## 2021-12-14 ENCOUNTER — Other Ambulatory Visit: Payer: Self-pay | Admitting: Obstetrics and Gynecology

## 2021-12-17 LAB — OB RESULTS CONSOLE GBS: GBS: NEGATIVE

## 2021-12-20 ENCOUNTER — Telehealth (HOSPITAL_COMMUNITY): Payer: Self-pay | Admitting: *Deleted

## 2021-12-20 ENCOUNTER — Encounter (HOSPITAL_COMMUNITY): Payer: Self-pay | Admitting: *Deleted

## 2021-12-20 NOTE — Telephone Encounter (Signed)
Preadmission screen  

## 2022-01-02 ENCOUNTER — Other Ambulatory Visit: Payer: Self-pay

## 2022-01-03 ENCOUNTER — Inpatient Hospital Stay (HOSPITAL_COMMUNITY): Payer: No Typology Code available for payment source | Admitting: Anesthesiology

## 2022-01-03 ENCOUNTER — Inpatient Hospital Stay (HOSPITAL_COMMUNITY): Payer: No Typology Code available for payment source

## 2022-01-03 ENCOUNTER — Encounter (HOSPITAL_COMMUNITY): Payer: Self-pay | Admitting: Obstetrics and Gynecology

## 2022-01-03 ENCOUNTER — Inpatient Hospital Stay (HOSPITAL_COMMUNITY)
Admission: AD | Admit: 2022-01-03 | Discharge: 2022-01-09 | DRG: 807 | Disposition: A | Discharge status: Home / Self Care | Payer: No Typology Code available for payment source | Attending: Obstetrics & Gynecology | Admitting: Obstetrics & Gynecology

## 2022-01-03 DIAGNOSIS — O9952 Diseases of the respiratory system complicating childbirth: Secondary | ICD-10-CM | POA: Diagnosis present

## 2022-01-03 DIAGNOSIS — Z3490 Encounter for supervision of normal pregnancy, unspecified, unspecified trimester: Secondary | ICD-10-CM | POA: Diagnosis present

## 2022-01-03 DIAGNOSIS — Z3A39 39 weeks gestation of pregnancy: Secondary | ICD-10-CM | POA: Diagnosis not present

## 2022-01-03 DIAGNOSIS — J45909 Unspecified asthma, uncomplicated: Secondary | ICD-10-CM | POA: Diagnosis present

## 2022-01-03 DIAGNOSIS — R002 Palpitations: Secondary | ICD-10-CM | POA: Diagnosis not present

## 2022-01-03 DIAGNOSIS — O115 Pre-existing hypertension with pre-eclampsia, complicating the puerperium: Secondary | ICD-10-CM | POA: Diagnosis not present

## 2022-01-03 DIAGNOSIS — O26893 Other specified pregnancy related conditions, third trimester: Secondary | ICD-10-CM | POA: Diagnosis present

## 2022-01-03 DIAGNOSIS — O99893 Other specified diseases and conditions complicating puerperium: Secondary | ICD-10-CM | POA: Diagnosis not present

## 2022-01-03 DIAGNOSIS — O99214 Obesity complicating childbirth: Secondary | ICD-10-CM | POA: Diagnosis present

## 2022-01-03 DIAGNOSIS — R000 Tachycardia, unspecified: Secondary | ICD-10-CM | POA: Diagnosis not present

## 2022-01-03 DIAGNOSIS — O1002 Pre-existing essential hypertension complicating childbirth: Secondary | ICD-10-CM | POA: Diagnosis present

## 2022-01-03 DIAGNOSIS — O133 Gestational [pregnancy-induced] hypertension without significant proteinuria, third trimester: Secondary | ICD-10-CM

## 2022-01-03 LAB — CBC
HCT: 36.1 % (ref 36.0–46.0)
Hemoglobin: 12.8 g/dL (ref 12.0–15.0)
MCH: 31.4 pg (ref 26.0–34.0)
MCHC: 35.5 g/dL (ref 30.0–36.0)
MCV: 88.5 fL (ref 80.0–100.0)
Platelets: 187 10*3/uL (ref 150–400)
RBC: 4.08 MIL/uL (ref 3.87–5.11)
RDW: 13.7 % (ref 11.5–15.5)
WBC: 16.5 10*3/uL — ABNORMAL HIGH (ref 4.0–10.5)
nRBC: 0 % (ref 0.0–0.2)

## 2022-01-03 LAB — COMPREHENSIVE METABOLIC PANEL
ALT: 11 U/L (ref 0–44)
AST: 17 U/L (ref 15–41)
Albumin: 2.5 g/dL — ABNORMAL LOW (ref 3.5–5.0)
Alkaline Phosphatase: 91 U/L (ref 38–126)
Anion gap: 8 (ref 5–15)
BUN: 11 mg/dL (ref 6–20)
CO2: 21 mmol/L — ABNORMAL LOW (ref 22–32)
Calcium: 9.5 mg/dL (ref 8.9–10.3)
Chloride: 108 mmol/L (ref 98–111)
Creatinine, Ser: 0.77 mg/dL (ref 0.44–1.00)
GFR, Estimated: >60 mL/min (ref >60)
Glucose, Bld: 151 mg/dL — ABNORMAL HIGH (ref 70–99)
Potassium: 3.6 mmol/L (ref 3.5–5.1)
Sodium: 137 mmol/L (ref 135–145)
Total Bilirubin: 0.4 mg/dL (ref 0.3–1.2)
Total Protein: 6 g/dL — ABNORMAL LOW (ref 6.5–8.1)

## 2022-01-03 LAB — PROTEIN / CREATININE RATIO, URINE
Creatinine, Urine: 164.97 mg/dL
Protein Creatinine Ratio: 0.05 mg/mg{Cre} (ref 0.00–0.15)
Total Protein, Urine: 8 mg/dL

## 2022-01-03 LAB — RPR: RPR Ser Ql: NONREACTIVE

## 2022-01-03 LAB — TYPE AND SCREEN
ABO/RH(D): A NEG
Antibody Screen: NEGATIVE

## 2022-01-03 MED ORDER — LABETALOL HCL 5 MG/ML IV SOLN
20.0000 mg | INTRAVENOUS | Status: DC | PRN
Start: 2022-01-03 — End: 2022-01-04
  Administered 2022-01-03 (×2): 20 mg via INTRAVENOUS
  Filled 2022-01-03: qty 4

## 2022-01-03 MED ORDER — PHENYLEPHRINE 80 MCG/ML (10ML) SYRINGE FOR IV PUSH (FOR BLOOD PRESSURE SUPPORT)
80.0000 ug | PREFILLED_SYRINGE | INTRAVENOUS | Status: DC | PRN
Start: 2022-01-03 — End: 2022-01-04

## 2022-01-03 MED ORDER — SOD CITRATE-CITRIC ACID 500-334 MG/5ML PO SOLN: 30.0000 mL | ORAL | Status: DC | PRN

## 2022-01-03 MED ORDER — ESCITALOPRAM OXALATE 10 MG PO TABS
20.0000 mg | ORAL_TABLET | Freq: Every day | ORAL | Status: DC
  Administered 2022-01-03: 20 mg via ORAL
  Filled 2022-01-03: qty 1

## 2022-01-03 MED ORDER — ACETAMINOPHEN 325 MG PO TABS
650.0000 mg | ORAL_TABLET | ORAL | Status: DC | PRN
Start: 2022-01-03 — End: 2022-01-04

## 2022-01-03 MED ORDER — PHENYLEPHRINE 80 MCG/ML (10ML) SYRINGE FOR IV PUSH (FOR BLOOD PRESSURE SUPPORT): 80.0000 ug | PREFILLED_SYRINGE | INTRAVENOUS | Status: DC | PRN

## 2022-01-03 MED ORDER — LABETALOL HCL 5 MG/ML IV SOLN
INTRAVENOUS | Status: AC
  Filled 2022-01-03: qty 4

## 2022-01-03 MED ORDER — LACTATED RINGERS IV SOLN: 500.0000 mL | INTRAVENOUS | Status: DC | PRN

## 2022-01-03 MED ORDER — FENTANYL-BUPIVACAINE-NACL 0.5-0.125-0.9 MG/250ML-% EP SOLN
12.0000 mL/h | EPIDURAL | Status: DC | PRN
  Administered 2022-01-03: 12 mL/h via EPIDURAL
  Filled 2022-01-03: qty 250

## 2022-01-03 MED ORDER — TERBUTALINE SULFATE 1 MG/ML IJ SOLN: 0.2500 mg | Freq: Once | INTRAMUSCULAR | Status: DC | PRN

## 2022-01-03 MED ORDER — LABETALOL HCL 5 MG/ML IV SOLN: 80.0000 mg | INTRAVENOUS | Status: DC | PRN

## 2022-01-03 MED ORDER — OXYTOCIN BOLUS FROM INFUSION
333.0000 mL | Freq: Once | INTRAVENOUS | Status: AC
  Administered 2022-01-03: 333 mL via INTRAVENOUS

## 2022-01-03 MED ORDER — LABETALOL HCL 100 MG PO TABS
100.0000 mg | ORAL_TABLET | Freq: Two times a day (BID) | ORAL | Status: DC
  Administered 2022-01-03 (×2): 100 mg via ORAL
  Filled 2022-01-03 (×2): qty 1

## 2022-01-03 MED ORDER — MISOPROSTOL 25 MCG QUARTER TABLET
25.0000 ug | ORAL_TABLET | ORAL | Status: DC | PRN
  Administered 2022-01-03 (×2): 25 ug via VAGINAL
  Filled 2022-01-03 (×2): qty 1

## 2022-01-03 MED ORDER — ONDANSETRON HCL 4 MG/2ML IJ SOLN
4.0000 mg | Freq: Four times a day (QID) | INTRAMUSCULAR | Status: DC | PRN
  Administered 2022-01-03: 4 mg via INTRAVENOUS
  Filled 2022-01-03: qty 2

## 2022-01-03 MED ORDER — LIDOCAINE-EPINEPHRINE (PF) 1.5 %-1:200000 IJ SOLN
INTRAMUSCULAR | Status: DC | PRN
  Administered 2022-01-03: 2 mL via PERINEURAL

## 2022-01-03 MED ORDER — DIPHENHYDRAMINE HCL 50 MG/ML IJ SOLN
12.5000 mg | INTRAMUSCULAR | Status: AC | PRN
  Administered 2022-01-03 (×3): 12.5 mg via INTRAVENOUS
  Filled 2022-01-03: qty 1

## 2022-01-03 MED ORDER — LIDOCAINE HCL (PF) 1 % IJ SOLN
INTRAMUSCULAR | Status: DC | PRN
  Administered 2022-01-03: 6 mL via EPIDURAL

## 2022-01-03 MED ORDER — HYDRALAZINE HCL 20 MG/ML IJ SOLN
10.0000 mg | INTRAMUSCULAR | Status: DC | PRN
Start: 2022-01-03 — End: 2022-01-04

## 2022-01-03 MED ORDER — LIDOCAINE HCL (PF) 1 % IJ SOLN: 30.0000 mL | INTRAMUSCULAR | Status: DC | PRN

## 2022-01-03 MED ORDER — FLEET ENEMA 7-19 GM/118ML RE ENEM
1.0000 | ENEMA | RECTAL | Status: DC | PRN
Start: 2022-01-03 — End: 2022-01-04

## 2022-01-03 MED ORDER — MISOPROSTOL 50MCG HALF TABLET
50.0000 ug | ORAL_TABLET | ORAL | Status: DC | PRN
  Administered 2022-01-03: 50 ug via ORAL
  Filled 2022-01-03: qty 1

## 2022-01-03 MED ORDER — LABETALOL HCL 5 MG/ML IV SOLN: 40.0000 mg | INTRAVENOUS | Status: DC | PRN

## 2022-01-03 MED ORDER — LACTATED RINGERS IV SOLN
500.0000 mL | Freq: Once | INTRAVENOUS | Status: AC
  Administered 2022-01-03: 500 mL via INTRAVENOUS

## 2022-01-03 MED ORDER — OXYCODONE-ACETAMINOPHEN 5-325 MG PO TABS: 1.0000 | ORAL_TABLET | ORAL | Status: DC | PRN

## 2022-01-03 MED ORDER — EPHEDRINE 5 MG/ML INJ: 10.0000 mg | INTRAVENOUS | Status: DC | PRN

## 2022-01-03 MED ORDER — OXYTOCIN-SODIUM CHLORIDE 30-0.9 UT/500ML-% IV SOLN
2.5000 [IU]/h | INTRAVENOUS | Status: DC
  Filled 2022-01-03: qty 500

## 2022-01-03 MED ORDER — NYSTATIN-TRIAMCINOLONE 100000-0.1 UNIT/GM-% EX CREA
TOPICAL_CREAM | CUTANEOUS | Status: DC | PRN
  Filled 2022-01-03: qty 15
  Filled 2022-01-03: qty 30

## 2022-01-03 MED ORDER — TRANEXAMIC ACID-NACL 1000-0.7 MG/100ML-% IV SOLN
INTRAVENOUS | Status: AC
  Administered 2022-01-03: 1000 mg
  Filled 2022-01-03: qty 100

## 2022-01-03 MED ORDER — OXYCODONE-ACETAMINOPHEN 5-325 MG PO TABS: 2.0000 | ORAL_TABLET | ORAL | Status: DC | PRN

## 2022-01-03 MED ORDER — DOXYLAMINE SUCCINATE (SLEEP) 25 MG PO TABS
25.0000 mg | ORAL_TABLET | Freq: Every evening | ORAL | Status: DC | PRN
Start: 2022-01-03 — End: 2022-01-04
  Administered 2022-01-03: 25 mg via ORAL
  Filled 2022-01-03 (×2): qty 1

## 2022-01-03 MED ORDER — OXYTOCIN-SODIUM CHLORIDE 30-0.9 UT/500ML-% IV SOLN
1.0000 m[IU]/min | INTRAVENOUS | Status: DC
  Administered 2022-01-03: 2 m[IU]/min via INTRAVENOUS

## 2022-01-03 MED ORDER — FENTANYL CITRATE (PF) 100 MCG/2ML IJ SOLN
100.0000 ug | INTRAMUSCULAR | Status: DC | PRN
  Administered 2022-01-03: 100 ug via INTRAVENOUS
  Filled 2022-01-03: qty 2

## 2022-01-03 MED ORDER — LACTATED RINGERS IV SOLN
INTRAVENOUS | Status: DC
Start: 2022-01-03 — End: 2022-01-04

## 2022-01-03 NOTE — Lactation Note (Signed)
This note was copied from a baby's chart. Lactation Consultation Note  Patient Name: [name] [id number] Date: 01/03/2022 Reason for consult: L&D Initial assessment;1st time breastfeeding Age:40 hours LC changed void diaper while in room. Mom latched infant on her right breast using the football hold position, infant was breastfeeding  for 10 minutes, suddenly turned blue in face and upper torso. LC did back blows gently on infant, infant began to cry, as LC called RN. Mom knows to ask for BF assistance later on MBU. Mom knows to breastfeed infant according to hunger cues, on demand, skin to skin. LC congratulated parents on the birth of their son.   Maternal Data    Feeding Mother's Current Feeding Choice: Breast Milk  LATCH Score Latch: Grasps breast easily, tongue down, lips flanged, rhythmical sucking.  Audible Swallowing: A few with stimulation  Type of Nipple: Everted at rest and after stimulation  Comfort (Breast/Nipple): Soft / non-tender  Hold (Positioning): Assistance needed to correctly position infant at breast and maintain latch.  LATCH Score: 8   Lactation Tools Discussed/Used    Interventions    Discharge    Consult Status Consult Status: Follow-up from L&D    Danelle Earthly 01/03/2022, 11:02 PM

## 2022-01-03 NOTE — Anesthesia Preprocedure Evaluation (Signed)
Anesthesia Evaluation  Patient identified by MRN, date of birth, ID band Patient awake    Reviewed: Allergy & Precautions, H&P , NPO status , Patient's Chart, lab work & pertinent test results  History of Anesthesia Complications Negative for: history of anesthetic complications  Airway Mallampati: II  TM Distance: >3 FB     Dental   Pulmonary asthma ,    Pulmonary exam normal        Cardiovascular hypertension,  Rhythm:regular Rate:Normal     Neuro/Psych negative neurological ROS  negative psych ROS   GI/Hepatic negative GI ROS, Neg liver ROS,   Endo/Other  Morbid obesity  Renal/GU      Musculoskeletal   Abdominal   Peds  Hematology negative hematology ROS (+)   Anesthesia Other Findings   Reproductive/Obstetrics (+) Pregnancy                             Anesthesia Physical Anesthesia Plan  ASA: 3  Anesthesia Plan: Epidural   Post-op Pain Management:    Induction:   PONV Risk Score and Plan:   Airway Management Planned:   Additional Equipment:   Intra-op Plan:   Post-operative Plan:   Informed Consent: I have reviewed the patients History and Physical, chart, labs and discussed the procedure including the risks, benefits and alternatives for the proposed anesthesia with the patient or authorized representative who has indicated his/her understanding and acceptance.       Plan Discussed with:   Anesthesia Plan Comments:         Anesthesia Quick Evaluation

## 2022-01-03 NOTE — Anesthesia Procedure Notes (Signed)
Epidural Patient location during procedure: OB Start time: 01/03/2022 10:53 AM End time: 01/03/2022 11:06 AM  Staffing Anesthesiologist: Lucretia Kern, MD Performed: anesthesiologist   Preanesthetic Checklist Completed: patient identified, IV checked, risks and benefits discussed, monitors and equipment checked, pre-op evaluation and timeout performed  Epidural Patient position: sitting Prep: DuraPrep Patient monitoring: heart rate, continuous pulse ox and blood pressure Approach: midline Location: L3-L4 Injection technique: LOR air  Needle:  Needle type: Tuohy  Needle gauge: 17 G Needle length: 9 cm Needle insertion depth: 7 cm Catheter type: closed end flexible Catheter size: 19 Gauge Catheter at skin depth: 12 cm Test dose: negative and 1.5% lidocaine with Epi 1:200 K  Assessment Events: injection not painful, no injection resistance, no paresthesia and negative IV test  Additional Notes Small amount of heme aspirated from catheter, not free flowing. Test dose with epinephrine given with no HR or BP response. Reason for block:procedure for pain

## 2022-01-03 NOTE — H&P (Signed)
[name] is a 41 y.o. female presenting for IOL with no complaints.  OB History     Gravida  2   Para      Term      Preterm      AB  1   Living  0      SAB      IAB  1   Ectopic      Multiple      Live Births             Past Medical History:  Diagnosis Date   Abnormal Pap smear    cin-1   Asthma    Depression    Hx of migraines    Hypertension    PRE    IBS (irritable bowel syndrome)    Pregnancy induced hypertension    Vaginal Pap smear, abnormal    Past Surgical History:  Procedure Laterality Date   CHOLECYSTECTOMY     COLPOSCOPY     WISDOM TOOTH EXTRACTION     Family History: family history includes Cancer in her father; Diabetes in her paternal grandmother; Heart disease in her maternal grandmother; Hyperlipidemia in her mother; Hypertension in her brother; Kidney disease in her maternal grandmother; Stroke in her paternal grandmother; Thyroid disease in her maternal grandmother and mother. Social History:  reports that she has never smoked. She has never used smokeless tobacco. She reports that she does not currently use alcohol. She reports that she does not use drugs.     Maternal Diabetes: No Genetic Screening: Declined Maternal Ultrasounds/Referrals: Normal Fetal Ultrasounds or other Referrals:  None Maternal Substance Abuse:  No Significant Maternal Medications:  Meds include: Other: labetalol Significant Maternal Lab Results:  Group B Strep negative Other Comments:  None  Review of Systems History Dilation: Closed Effacement (%): Thick Station: Ballotable Exam by:: Mary Martinique Johnson, RNC-OB Blood pressure 140/81, pulse 72, temperature 98.6 F (37 C), temperature source Oral, resp. rate 16, height 5\' 7"  (1.702 m), weight 122.4 kg, last menstrual period 03/29/2021. Exam Physical Exam  Lungs CTA CV RRR Abdomen gravid, NT Ext no calf tenderness  Prenatal labs: ABO, Rh: --/--/A NEG (06/01 0030) Antibody: NEG  (06/01 0030) Rubella: Immune (10/28 0000) RPR: Nonreactive (10/28 0000)  HBsAg: Negative (10/28 0000)  HIV: Non-reactive (10/28 0000)  GBS: Negative/-- (05/15 0000)   Assessment/Plan: P0 at 39 1/7 wks admitted for IOL secondary to h/o CHTN on labetalol and increased BMI.   Unfavorable cervix - start cytotec FHT cat 1 Pain medication upon request Check PIH labs Cont labetalol 100mg  BID    Delice Lesch 01/03/2022, 2:03 AM

## 2022-01-03 NOTE — Progress Notes (Signed)
Subjective:    Comfortable w/ epidural. Spouse and mother supportive. Doula present.   Objective:    VS: BP (!) 144/97   Pulse 100   Temp 98 F (36.7 C) (Oral)   Resp 18   Ht 5\' 7"  (1.702 m)   Wt 122.4 kg   LMP 03/29/2021   BMI 42.27 kg/m  FHR : baseline 130 / variability moderate / accelerations present / absent decelerations Toco: contractions every 2-3 minutes  Membranes: AROM x 2 hrs meconium Dilation: 10 Dilation Complete Date: 01/03/22 Dilation Complete Time: 2024 Effacement (%): 100 Cervical Position: Posterior Station: Plus 2 Presentation: Vertex Exam by:: Mary Martinique Johnson, RNC-OB Pitocin 8 mU/min  Assessment/Plan:   41 y.o. G2P0010 [redacted]w[redacted]d IOL for GHTN  Labor: Progressing normally GHTN:  no signs or symptoms of toxicity Fetal Wellbeing:  Category I Pain Control:  Epidural I/D:   GBS neg Anticipated MOD:  NSVD  Arrie Eastern MSN, CNM 01/03/2022 10:43 PM

## 2022-01-04 DIAGNOSIS — O133 Gestational [pregnancy-induced] hypertension without significant proteinuria, third trimester: Secondary | ICD-10-CM | POA: Diagnosis present

## 2022-01-04 LAB — CBC
HCT: 32.4 % — ABNORMAL LOW (ref 36.0–46.0)
HCT: 36.6 % (ref 36.0–46.0)
Hemoglobin: 11.1 g/dL — ABNORMAL LOW (ref 12.0–15.0)
Hemoglobin: 12.2 g/dL (ref 12.0–15.0)
MCH: 30.3 pg (ref 26.0–34.0)
MCH: 30.7 pg (ref 26.0–34.0)
MCHC: 33.3 g/dL (ref 30.0–36.0)
MCHC: 34.3 g/dL (ref 30.0–36.0)
MCV: 89.5 fL (ref 80.0–100.0)
MCV: 90.8 fL (ref 80.0–100.0)
Platelets: 165 10*3/uL (ref 150–400)
Platelets: 166 10*3/uL (ref 150–400)
RBC: 3.62 MIL/uL — ABNORMAL LOW (ref 3.87–5.11)
RBC: 4.03 MIL/uL (ref 3.87–5.11)
RDW: 13.6 % (ref 11.5–15.5)
RDW: 13.8 % (ref 11.5–15.5)
WBC: 24.6 10*3/uL — ABNORMAL HIGH (ref 4.0–10.5)
WBC: 25.7 10*3/uL — ABNORMAL HIGH (ref 4.0–10.5)
nRBC: 0 % (ref 0.0–0.2)
nRBC: 0 % (ref 0.0–0.2)

## 2022-01-04 LAB — COMPREHENSIVE METABOLIC PANEL
ALT: 14 U/L (ref 0–44)
AST: 24 U/L (ref 15–41)
Albumin: 2.1 g/dL — ABNORMAL LOW (ref 3.5–5.0)
Alkaline Phosphatase: 78 U/L (ref 38–126)
Anion gap: 6 (ref 5–15)
BUN: 9 mg/dL (ref 6–20)
CO2: 23 mmol/L (ref 22–32)
Calcium: 8.9 mg/dL (ref 8.9–10.3)
Chloride: 108 mmol/L (ref 98–111)
Creatinine, Ser: 0.86 mg/dL (ref 0.44–1.00)
GFR, Estimated: >60 mL/min (ref >60)
Glucose, Bld: 114 mg/dL — ABNORMAL HIGH (ref 70–99)
Potassium: 4.1 mmol/L (ref 3.5–5.1)
Sodium: 137 mmol/L (ref 135–145)
Total Bilirubin: 0.4 mg/dL (ref 0.3–1.2)
Total Protein: 5.1 g/dL — ABNORMAL LOW (ref 6.5–8.1)

## 2022-01-04 MED ORDER — OXYCODONE-ACETAMINOPHEN 5-325 MG PO TABS
1.0000 | ORAL_TABLET | ORAL | Status: DC | PRN
  Administered 2022-01-04 – 2022-01-09 (×22): 1 via ORAL
  Filled 2022-01-04 (×22): qty 1

## 2022-01-04 MED ORDER — WITCH HAZEL-GLYCERIN EX PADS
1.0000 "application " | MEDICATED_PAD | CUTANEOUS | Status: DC | PRN
  Administered 2022-01-05: 1 via TOPICAL

## 2022-01-04 MED ORDER — DIBUCAINE (PERIANAL) 1 % EX OINT: 1.0000 "application " | TOPICAL_OINTMENT | CUTANEOUS | Status: DC | PRN

## 2022-01-04 MED ORDER — MAGNESIUM OXIDE -MG SUPPLEMENT 400 (240 MG) MG PO TABS
400.0000 mg | ORAL_TABLET | Freq: Every day | ORAL | Status: DC
  Administered 2022-01-04 – 2022-01-09 (×6): 400 mg via ORAL
  Filled 2022-01-04 (×6): qty 1

## 2022-01-04 MED ORDER — IBUPROFEN 600 MG PO TABS
600.0000 mg | ORAL_TABLET | Freq: Four times a day (QID) | ORAL | Status: DC
  Administered 2022-01-04 – 2022-01-09 (×22): 600 mg via ORAL
  Filled 2022-01-04 (×22): qty 1

## 2022-01-04 MED ORDER — SODIUM CHLORIDE 0.9 % IV SOLN
3.0000 g | Freq: Four times a day (QID) | INTRAVENOUS | Status: DC
  Administered 2022-01-04 (×3): 3 g via INTRAVENOUS
  Filled 2022-01-04 (×4): qty 8

## 2022-01-04 MED ORDER — CYCLOBENZAPRINE HCL 10 MG PO TABS
5.0000 mg | ORAL_TABLET | Freq: Every day | ORAL | Status: DC | PRN
  Administered 2022-01-04: 10 mg via ORAL
  Filled 2022-01-04: qty 1

## 2022-01-04 MED ORDER — ONDANSETRON HCL 4 MG/2ML IJ SOLN: 4.0000 mg | INTRAMUSCULAR | Status: DC | PRN

## 2022-01-04 MED ORDER — OXYCODONE HCL 5 MG PO TABS
5.0000 mg | ORAL_TABLET | Freq: Four times a day (QID) | ORAL | Status: DC | PRN
  Administered 2022-01-04 (×3): 5 mg via ORAL
  Filled 2022-01-04 (×3): qty 1

## 2022-01-04 MED ORDER — BENZOCAINE-MENTHOL 20-0.5 % EX AERO
1.0000 "application " | INHALATION_SPRAY | CUTANEOUS | Status: DC | PRN
  Administered 2022-01-04 – 2022-01-07 (×2): 1 via TOPICAL
  Filled 2022-01-04 (×2): qty 56

## 2022-01-04 MED ORDER — SENNOSIDES-DOCUSATE SODIUM 8.6-50 MG PO TABS
2.0000 | ORAL_TABLET | ORAL | Status: DC
  Administered 2022-01-04 – 2022-01-08 (×5): 2 via ORAL
  Filled 2022-01-04 (×5): qty 2

## 2022-01-04 MED ORDER — ZOLPIDEM TARTRATE 5 MG PO TABS: 5.0000 mg | ORAL_TABLET | Freq: Every evening | ORAL | Status: DC | PRN

## 2022-01-04 MED ORDER — ONDANSETRON HCL 4 MG PO TABS
4.0000 mg | ORAL_TABLET | ORAL | Status: DC | PRN
  Administered 2022-01-07: 4 mg via ORAL
  Filled 2022-01-04: qty 1

## 2022-01-04 MED ORDER — TETANUS-DIPHTH-ACELL PERTUSSIS 5-2.5-18.5 LF-MCG/0.5 IM SUSY: 0.5000 mL | PREFILLED_SYRINGE | Freq: Once | INTRAMUSCULAR | Status: DC

## 2022-01-04 MED ORDER — COCONUT OIL OIL
1.0000 "application " | TOPICAL_OIL | Status: DC | PRN
  Administered 2022-01-08: 1 via TOPICAL

## 2022-01-04 MED ORDER — LABETALOL HCL 100 MG PO TABS
100.0000 mg | ORAL_TABLET | Freq: Three times a day (TID) | ORAL | Status: DC
  Administered 2022-01-04 – 2022-01-05 (×4): 100 mg via ORAL
  Filled 2022-01-04 (×7): qty 1

## 2022-01-04 MED ORDER — PRENATAL MULTIVITAMIN CH
1.0000 | ORAL_TABLET | Freq: Every day | ORAL | Status: DC
  Administered 2022-01-04 – 2022-01-09 (×6): 1 via ORAL
  Filled 2022-01-04 (×6): qty 1

## 2022-01-04 MED ORDER — ACETAMINOPHEN 500 MG PO TABS
1000.0000 mg | ORAL_TABLET | Freq: Four times a day (QID) | ORAL | Status: DC
  Administered 2022-01-08: 1000 mg via ORAL
  Filled 2022-01-04 (×8): qty 2

## 2022-01-04 MED ORDER — SIMETHICONE 80 MG PO CHEW: 80.0000 mg | CHEWABLE_TABLET | ORAL | Status: DC | PRN

## 2022-01-04 MED ORDER — ALBUTEROL SULFATE (2.5 MG/3ML) 0.083% IN NEBU
3.0000 mL | INHALATION_SOLUTION | RESPIRATORY_TRACT | Status: DC | PRN
Start: 2022-01-04 — End: 2022-01-09

## 2022-01-04 MED ORDER — DIPHENHYDRAMINE HCL 25 MG PO CAPS: 25.0000 mg | ORAL_CAPSULE | Freq: Four times a day (QID) | ORAL | Status: DC | PRN

## 2022-01-04 MED ORDER — LABETALOL HCL 100 MG PO TABS
100.0000 mg | ORAL_TABLET | Freq: Two times a day (BID) | ORAL | Status: DC
  Administered 2022-01-04: 100 mg via ORAL
  Filled 2022-01-04 (×2): qty 1

## 2022-01-04 MED ORDER — ACETAMINOPHEN 325 MG PO TABS
650.0000 mg | ORAL_TABLET | ORAL | Status: DC | PRN
  Administered 2022-01-04: 650 mg via ORAL
  Filled 2022-01-04: qty 2

## 2022-01-04 MED ORDER — ESCITALOPRAM OXALATE 10 MG PO TABS
20.0000 mg | ORAL_TABLET | Freq: Every day | ORAL | Status: DC
  Administered 2022-01-04 – 2022-01-09 (×6): 20 mg via ORAL
  Filled 2022-01-04 (×3): qty 1
  Filled 2022-01-04 (×3): qty 2

## 2022-01-04 NOTE — Lactation Note (Signed)
This note was copied from a baby's chart. Lactation Consultation Note I returned at RN's request to assist with bf'ing. When I arrived, mother was bottle feeding. We reviewed feeding behaviors in 1st 24 hours and I assisted her with sts p feeding was completed. I will plan to f/u tomorrow. All questions and concerns were addressed.   Patient Name: [name] [id number] Date: 01/04/2022   Age:40 hours  Feeding Nipple Type: Nfant Extra Slow Flow (gold)   Elder Negus 01/04/2022, 5:35 PM

## 2022-01-04 NOTE — Progress Notes (Signed)
   01/03/22 2017  Clinical Encounter Type  Visited With Patient and family together  Visit Type Initial;Spiritual support;Critical Care  Referral From Nurse  Consult/Referral To Chaplain  Spiritual Encounters  Spiritual Needs Emotional;Prayer   Chaplain Tery Sanfilippo responded to page for S206. Donnajean Lopes providing calming presence and staff support. Chaplain actively listened to the patient's mother tearfully spoke of the patient's delivery  experience. Chaplain prayed silently and gave words of congratulatory praise, asked open ended questions which inspired her to take pictures to capture the baby's movements and spirit of the room. Family expressed appreciation of visit. This note was prepared by Deneen Harts, M.Div..  For questions please contact by phone [phone].

## 2022-01-04 NOTE — Progress Notes (Addendum)
PPD# 1 SVD w/ 2nd degree laceration Information for the patient's newborn:  Arvetta, Araque [[id number]]  female    Circumcision desires in-pt and baby is in NICU   S:   Reports feeling "tired and scared" infant in NICU Tolerating PO fluid and solids No nausea or vomiting Bleeding is light, no clots Pain controlled with  PO meds Up ad lib / ambulatory / voiding w/o difficulty Feeding:  pumping for NICU     O:   VS: BP 136/85 (BP Location: Right Arm)   Pulse 90   Temp 99.2 F (37.3 C) (Axillary)   Resp 18   Ht 5\' 7"  (1.702 m)   Wt 122.4 kg   LMP 03/29/2021   SpO2 99%   Breastfeeding Unknown   BMI 42.27 kg/m   LABS:  Recent Labs    01/04/22 0002 01/04/22 0441  WBC 25.7* 24.6*  HGB 12.2 11.1*  PLT 166 165   Blood type: --/--/A NEG (06/01 0030) Rubella: Immune (10/28 0000)                      I&O: Intake/Output      06/01 0701 06/02 0700 06/02 0701 06/03 0700   P.O. 240    IV Piggyback 100    Total Intake(mL/kg) 340 (2.8)    Urine (mL/kg/hr) 875 (0.3)    Blood 400    Total Output 1275    Net -935         Urine Occurrence 1 x      Physical Exam: Alert and oriented X3 Lungs: Clear and unlabored Heart: regular rate and rhythm / no mumurs Abdomen: soft, non-tender, non-distended  Fundus: firm, non-tender, U-2 Perineum: edematous, well-approximated Lochia: appropriate Extremities: 1+ edema, no calf pain, tenderness, or cords    A:  PPD # 1  GHTN    -severe range pressure in the immediate PP period requiring    -normotensive to mild range BP   P:  Routine post partum orders Labs added Continue Unasyn 3G Q 6 x 24 hr after delivery for manual removal of placenta Anticipate D/C on 01/05/22  Plan reviewed w/ Dr. 03/07/22 and Dr. Sallye Ober, MSN, CNM 01/04/2022, 7:41 AM

## 2022-01-04 NOTE — Anesthesia Postprocedure Evaluation (Signed)
Anesthesia Post Note  Patient: TANYA SYLER  Procedure(s) Performed: AN AD HOC LABOR EPIDURAL     Patient location during evaluation: Mother Baby Anesthesia Type: Epidural Level of consciousness: awake, awake and alert and oriented Pain management: pain level controlled Vital Signs Assessment: post-procedure vital signs reviewed and stable Respiratory status: spontaneous breathing, nonlabored ventilation and respiratory function stable Cardiovascular status: stable Postop Assessment: patient able to bend at knees, no apparent nausea or vomiting, adequate PO intake, able to ambulate and no headache Anesthetic complications: no   No notable events documented.  Last Vitals:  Vitals:   01/04/22 1200 01/04/22 1600  BP: (!) 144/82 (!) 142/77  Pulse: 86 81  Resp: 17 16  Temp: 36.9 C 36.8 C  SpO2: 99% 98%    Last Pain:  Vitals:   01/04/22 1600  TempSrc: Oral  PainSc:    Pain Goal: Patients Stated Pain Goal: 2 (01/04/22 1335)                 Mekiah Cambridge

## 2022-01-04 NOTE — Lactation Note (Signed)
This note was copied from a baby's chart.  NICU Lactation Consultation Note  Patient Name: Boy Ashtan Paynter [id number] Date: 01/04/2022 Age:40 hours   Subjective Reason for consult: Initial assessment Mother has initiated pumping. I assessed flange size and reinforced pumping ed during my visit. Infant was sleeping soundly. Mother is aware that [facility] services are available in NICU and that I will gladly return later today to assist with bf'ing when she and Alroy Dust are ready.   Objective Infant data: Mother's Current Feeding Choice: Breast Milk and Donor Milk  Infant feeding assessment Scale for Readiness: 2 Scale for Quality: 2   Maternal data: G2P1011  Vaginal, Spontaneous Does the patient have breastfeeding experience prior to this delivery?: No Pump: DEBP (Medela)  Assessment Maternal: 65mm flange is appropriate size at this time.  Intervention/Plan Interventions: "The NICU and Your Baby" book; [employer] brochure; Tour manager education; Education  Tools: Pump Pump Education: Setup, frequency, and cleaning; Milk Storage  Plan: Consult Status: NICU follow-up  NICU Follow-up type: New admission follow up; Verify absence of engorgement; Weekly NICU follow up; Maternal D/C visit; Verify onset of copious milk; Assist with IDF-2 (Mother does not need to pre-pump before breastfeeding)  Mother to pump q3h   Gwynne Edinger 01/04/2022, 9:13 AM

## 2022-01-05 ENCOUNTER — Encounter (HOSPITAL_COMMUNITY): Payer: Self-pay | Admitting: Obstetrics and Gynecology

## 2022-01-05 LAB — CBC WITH DIFFERENTIAL/PLATELET
Abs Immature Granulocytes: 0.12 10*3/uL — ABNORMAL HIGH (ref 0.00–0.07)
Basophils Absolute: 0.1 10*3/uL (ref 0.0–0.1)
Basophils Relative: 1 %
Eosinophils Absolute: 0.3 10*3/uL (ref 0.0–0.5)
Eosinophils Relative: 2 %
HCT: 30.8 % — ABNORMAL LOW (ref 36.0–46.0)
Hemoglobin: 10.3 g/dL — ABNORMAL LOW (ref 12.0–15.0)
Immature Granulocytes: 1 %
Lymphocytes Relative: 22 %
Lymphs Abs: 2.6 10*3/uL (ref 0.7–4.0)
MCH: 30.3 pg (ref 26.0–34.0)
MCHC: 33.4 g/dL (ref 30.0–36.0)
MCV: 90.6 fL (ref 80.0–100.0)
Monocytes Absolute: 0.6 10*3/uL (ref 0.1–1.0)
Monocytes Relative: 5 %
Neutro Abs: 8.4 10*3/uL — ABNORMAL HIGH (ref 1.7–7.7)
Neutrophils Relative %: 69 %
Platelets: 157 10*3/uL (ref 150–400)
RBC: 3.4 MIL/uL — ABNORMAL LOW (ref 3.87–5.11)
RDW: 14 % (ref 11.5–15.5)
WBC: 12.1 10*3/uL — ABNORMAL HIGH (ref 4.0–10.5)
nRBC: 0 % (ref 0.0–0.2)

## 2022-01-05 LAB — PROTEIN / CREATININE RATIO, URINE
Creatinine, Urine: 57.55 mg/dL
Total Protein, Urine: <6 mg/dL

## 2022-01-05 LAB — COMPREHENSIVE METABOLIC PANEL
ALT: 22 U/L (ref 0–44)
AST: 32 U/L (ref 15–41)
Albumin: 2.2 g/dL — ABNORMAL LOW (ref 3.5–5.0)
Alkaline Phosphatase: 87 U/L (ref 38–126)
Anion gap: 7 (ref 5–15)
BUN: 10 mg/dL (ref 6–20)
CO2: 22 mmol/L (ref 22–32)
Calcium: 8.9 mg/dL (ref 8.9–10.3)
Chloride: 108 mmol/L (ref 98–111)
Creatinine, Ser: 0.72 mg/dL (ref 0.44–1.00)
GFR, Estimated: >60 mL/min (ref >60)
Glucose, Bld: 95 mg/dL (ref 70–99)
Potassium: 3.9 mmol/L (ref 3.5–5.1)
Sodium: 137 mmol/L (ref 135–145)
Total Bilirubin: 0.3 mg/dL (ref 0.3–1.2)
Total Protein: 5.4 g/dL — ABNORMAL LOW (ref 6.5–8.1)

## 2022-01-05 MED ORDER — NIFEDIPINE ER OSMOTIC RELEASE 60 MG PO TB24
60.0000 mg | ORAL_TABLET | Freq: Every day | ORAL | Status: DC
  Administered 2022-01-05 – 2022-01-07 (×3): 60 mg via ORAL
  Filled 2022-01-05 (×3): qty 1

## 2022-01-05 MED ORDER — NIFEDIPINE 10 MG PO CAPS: 20.0000 mg | ORAL_CAPSULE | ORAL | Status: DC | PRN

## 2022-01-05 MED ORDER — NIFEDIPINE 10 MG PO CAPS: 10.0000 mg | ORAL_CAPSULE | ORAL | Status: DC | PRN

## 2022-01-05 MED ORDER — LABETALOL HCL 5 MG/ML IV SOLN: 40.0000 mg | INTRAVENOUS | Status: DC | PRN

## 2022-01-05 NOTE — Lactation Note (Signed)
This note was copied from a baby's chart.  NICU Lactation Consultation Note  Patient Name: [name] [name] Date: 01/05/2022 Age:40 hours   Subjective Reason for consult: Follow-up assessment; Breastfeeding assistance I assisted mother and infant with positioning and observed infant bf. I reviewed feeding expectations on 2nd day of life and answered her questions.  Mother remarked she felt unwell but wanted to continue the infant feeding. I notified her RN and left her in the care of her RN. Both the RN and mother are aware that I am available throughout the day to return and assist further prn.   Objective Infant data: Mother's Current Feeding Choice: Breast Milk and Donor Milk  Infant feeding assessment Scale for Readiness: 2 Scale for Quality: 2  Maternal data: G2P1011  Vaginal, Spontaneous Significant Breast History:: marked breast assymetry  Does the patient have breastfeeding experience prior to this delivery?: No  Risk factor for low milk supply:: Possible IGT of R breast   Pump: DEBP (Medela)  Assessment Infant: LATCH Score: 9  Infant breastfed effectively during my visit.  Maternal: Mother may have low R-breast milk volume.  Intervention/Plan Interventions: Education; Infant Driven Feeding Algorithm education  Tools: Pump Pump Education: Setup, frequency, and cleaning; Milk Storage  Plan: Consult Status: NICU follow-up  NICU Follow-up type: Assist with IDF-2 (Mother does not need to pre-pump before breastfeeding); Verify absence of engorgement; Verify onset of copious milk; Maternal D/C visit  Mother and infant to continue bf'ing q3h. Mother to post pump prn.   Elder Negus 01/05/2022, 9:45 AM

## 2022-01-06 ENCOUNTER — Encounter (HOSPITAL_COMMUNITY): Payer: Self-pay | Admitting: Obstetrics and Gynecology

## 2022-01-06 LAB — CBC
HCT: 33.9 % — ABNORMAL LOW (ref 36.0–46.0)
Hemoglobin: 11.4 g/dL — ABNORMAL LOW (ref 12.0–15.0)
MCH: 30.5 pg (ref 26.0–34.0)
MCHC: 33.6 g/dL (ref 30.0–36.0)
MCV: 90.6 fL (ref 80.0–100.0)
Platelets: 180 10*3/uL (ref 150–400)
RBC: 3.74 MIL/uL — ABNORMAL LOW (ref 3.87–5.11)
RDW: 13.7 % (ref 11.5–15.5)
WBC: 12.8 10*3/uL — ABNORMAL HIGH (ref 4.0–10.5)
nRBC: 0 % (ref 0.0–0.2)

## 2022-01-06 LAB — COMPREHENSIVE METABOLIC PANEL
ALT: 42 U/L (ref 0–44)
AST: 73 U/L — ABNORMAL HIGH (ref 15–41)
Albumin: 2.6 g/dL — ABNORMAL LOW (ref 3.5–5.0)
Alkaline Phosphatase: 122 U/L (ref 38–126)
Anion gap: 9 (ref 5–15)
BUN: 8 mg/dL (ref 6–20)
CO2: 24 mmol/L (ref 22–32)
Calcium: 9.1 mg/dL (ref 8.9–10.3)
Chloride: 105 mmol/L (ref 98–111)
Creatinine, Ser: 0.75 mg/dL (ref 0.44–1.00)
GFR, Estimated: >60 mL/min (ref >60)
Glucose, Bld: 91 mg/dL (ref 70–99)
Potassium: 3.9 mmol/L (ref 3.5–5.1)
Sodium: 138 mmol/L (ref 135–145)
Total Bilirubin: 0.6 mg/dL (ref 0.3–1.2)
Total Protein: 6.2 g/dL — ABNORMAL LOW (ref 6.5–8.1)

## 2022-01-06 MED ORDER — HYDRALAZINE HCL 20 MG/ML IJ SOLN: 10.0000 mg | INTRAMUSCULAR | Status: DC | PRN

## 2022-01-06 MED ORDER — OXYCODONE HCL 5 MG PO TABS
5.0000 mg | ORAL_TABLET | Freq: Once | ORAL | Status: AC
  Administered 2022-01-06: 5 mg via ORAL
  Filled 2022-01-06: qty 1

## 2022-01-06 MED ORDER — LACTATED RINGERS IV SOLN: INTRAVENOUS | Status: DC

## 2022-01-06 MED ORDER — IBUPROFEN 600 MG PO TABS: 600.0000 mg | ORAL_TABLET | Freq: Four times a day (QID) | ORAL | 6 refills | Status: DC | PRN

## 2022-01-06 MED ORDER — OXYCODONE-ACETAMINOPHEN 5-325 MG PO TABS: 1.0000 | ORAL_TABLET | ORAL | 0 refills | Status: DC | PRN

## 2022-01-06 MED ORDER — NIFEDIPINE ER 60 MG PO TB24: 60.0000 mg | ORAL_TABLET | Freq: Every day | ORAL | 1 refills | Status: DC

## 2022-01-06 MED ORDER — LABETALOL HCL 5 MG/ML IV SOLN: 20.0000 mg | INTRAVENOUS | Status: DC | PRN

## 2022-01-06 MED ORDER — MAGNESIUM SULFATE BOLUS VIA INFUSION
4.0000 g | Freq: Once | INTRAVENOUS | Status: AC
  Administered 2022-01-06: 4 g via INTRAVENOUS
  Filled 2022-01-06: qty 1000

## 2022-01-06 MED ORDER — LABETALOL HCL 5 MG/ML IV SOLN: 40.0000 mg | INTRAVENOUS | Status: DC | PRN

## 2022-01-06 MED ORDER — MAGNESIUM SULFATE 40 GM/1000ML IV SOLN
2.0000 g/h | INTRAVENOUS | Status: AC
Start: 2022-01-06 — End: 2022-01-07
  Administered 2022-01-06 – 2022-01-07 (×2): 2 g/h via INTRAVENOUS
  Filled 2022-01-06 (×3): qty 1000

## 2022-01-06 MED ORDER — OXYCODONE-ACETAMINOPHEN 5-325 MG PO TABS
1.0000 | ORAL_TABLET | Freq: Once | ORAL | Status: DC
  Filled 2022-01-06 (×2): qty 1

## 2022-01-06 MED ORDER — LABETALOL HCL 5 MG/ML IV SOLN: 80.0000 mg | INTRAVENOUS | Status: DC | PRN

## 2022-01-06 MED ORDER — SENNOSIDES-DOCUSATE SODIUM 8.6-50 MG PO TABS: 2.0000 | ORAL_TABLET | ORAL | 1 refills | Status: DC

## 2022-01-06 MED ORDER — SUMATRIPTAN SUCCINATE 25 MG PO TABS
25.0000 mg | ORAL_TABLET | ORAL | Status: DC | PRN
Start: 2022-01-06 — End: 2022-01-09
  Administered 2022-01-07: 25 mg via ORAL
  Filled 2022-01-06 (×2): qty 1

## 2022-01-06 MED ORDER — ACETAMINOPHEN 325 MG PO TABS
325.0000 mg | ORAL_TABLET | Freq: Once | ORAL | Status: AC
  Administered 2022-01-06: 325 mg via ORAL
  Filled 2022-01-06: qty 1

## 2022-01-06 NOTE — Progress Notes (Signed)
MD POSTPARTUM NOTE - *late entry note*  [name] was seen and examined at bedside by me yesterday evening in the NICU Couplet care. Patient is 41 yo G2 now P1 postpartum day#2 s/p SVD complicated by  Cord avulsion w/o hemorrhage. Baby admitted to NICU for apneic episode immediately after delivery.  Patient is s/p induction of labor at 39 weeks for chronic hypertension on labetalol 100 mg BID.  During labor course pt with labile BPs and received several doses of IV labetalol. Post partum her labetalol regimen was increased to 100 mg TID. Pre E labs within normal limits and patient asymptomatic.   NICU note states: "admitted to the NICU after cyanotic spell. Infant was born via vaginal delivery after IOL for maternal chronic hypertension. Tight nuchal cord, not able to reduce. At birth initial heart rate and tone good per report. After delayed cord clamping, infant brought to warmer as he had not initiated consistent spontaneous respiratory effort. Neonatal code called. Infant received PPV for 4 minutes then established consistent respiratory effort with normal saturations off oxygen. Infant observed further and continued to have normal saturations and heart rate over the next 7 minutes. Then placed skin to skin. At 1 hour follow up visit made and infant was vigorously eating, pink and with excellent tone. 30 minutes after this visit, I was called to access infant as he had dusky event while feeding. Per report, had been feeding off and on with multiple attempts to latch when he appeared dusky, "gurgle" sound made then was taken to warmer without consistent respiratory effort. Saturations were low and infant was given blowby.  Given this event, infant admitted to NICU.   Subjective: [name] stated she was "feeling fine".  She denied current headache, but notes she "had a mild dull headache earlier in the day that was alleviated by percocet.  She denied blurry vision or scotomata.  Patient denied  RUQ pain. Patient and baby bonding well, she is breast feeding, but was concerned about the lack of milk production,  Therefore she is supplementing breast w/ donor milk in bottle.  Pt ambulating, voiding and tolerating po w/o difficulty.  She denies abnormal bleeding. Her pain is managed with ibuprofen, tylenol, and prn oxycodone.    Objective:  Blood Pressure trends   01/04/22 0020 01/04/22 0745 01/04/22 1200 01/04/22 1600  BP: 136/85 140/81 (!) 144/82 (!) 142/77   01/04/22 1930 01/05/22 0500 01/05/22 0928 01/05/22 1443  BP: 129/73 130/75 (!) 154/90 (!) 162/85   01/05/22 1517 01/05/22 1745 01/05/22 2140 01/06/22 0640  BP: 140/76 126/76 136/78 129/80    Vitals:   01/05/22 1517 01/05/22 1745 01/05/22 2140 01/06/22 0640  BP: 140/76 126/76 136/78 129/80  Pulse:   87 80  Temp:  98.3 F (36.8 C) 98.3 F (36.8 C) 98.3 F (36.8 C)  Resp:  18 18 18   SpO2:  99% 97% 100%  TempSrc:  Oral Oral Oral     LABS:  Recent Labs     Latest Ref Rng & Units 01/05/2022    3:43 PM 01/04/2022    4:41 AM 01/04/2022   12:02 AM  CBC  WBC 4.0 - 10.5 K/uL 12.1   24.6   25.7    Hemoglobin 12.0 - 15.0 g/dL 03/06/2022   67.3   41.9    Hematocrit 36.0 - 46.0 % 30.8   32.4   36.6    Platelets 150 - 400 K/uL 157   165   166  Latest Ref Rng & Units 01/05/2022    3:43 PM 01/04/2022    4:41 AM 01/03/2022   12:27 AM  CMP  Glucose 70 - 99 mg/dL 95   606   301    BUN 6 - 20 mg/dL 10   9   11     Creatinine 0.44 - 1.00 mg/dL   6.01   0.93    Sodium 135 - 145 mmol/L 137   137   137    Potassium 3.5 - 5.1 mmol/L 3.9   4.1   3.6    Chloride 98 - 111 mmol/L 108   108   108    CO2 22 - 32 mmol/L 22   23   21     Calcium 8.9 - 10.3 mg/dL 8.9   8.9   9.5    Total Protein 6.5 - 8.1 g/dL 5.4   5.1   6.0    Total Bilirubin 0.3 - 1.2 mg/dL 0.3   0.4   0.4    Alkaline Phos 38 - 126 U/L 87   78   91    AST 15 - 41 U/L 32   24   17    ALT 0 - 44 U/L 22   14   11         Physical Exam: Alert and oriented  X3 Lungs: Clear and unlabored Heart: regular rate and rhythm / no mumurs Abdomen: soft, non-tender, non-distended  Fundus: firm, non-tender, U-1 Perineum: intact Lochia: scant Extremities: 1+ non-pitting edema, no calf pain or tenderness Neuro: no clonus, normal DTRs bilaterally 2+    Assessment/Plan: 41 year old G2P1 PPD # 2 s/p SVD with Chronic HTN labile blood pressures on po labetaol.  Patient doing well, no concern for preeclampsia.  Will D/C po labetalol and start Procardia 60 mg XR daily first dose "now" Patient observed overnight, otherwise she is now meeting discharge criteria Will discharge home. Patient given postpartum instructions, Pre E precautions and  Follow up in office 1 wk for BP check    MD 01/06/2022 7:18 AM

## 2022-01-06 NOTE — Lactation Note (Signed)
This note was copied from a baby's chart.  NICU Lactation Consultation Note  Patient Name: Boy Daphna Lafuente [id number] Date: 01/06/2022 Age:40 hours   Subjective Reason for consult: Follow-up assessment; NICU baby; Term; 1st time breastfeeding; Primapara; Other (Comment) (AMA, LGA)  Visited with mom twice, the first time in couplet care but had to finish the consult later in the afternoon due to her transfer from couplet care to [facility] Specialty care. Baby is at 67 hours of life FT NICU female, Ms. Chiles is a P1 and reports she hasn't been pumping consistently due to her health status. LC showed empathy and understanding but also explained the importance of consistent pumping for the onset of lactogenesis II, she voiced understanding. She had several questions, the main one was regarding putting baby to breast and pumping. Explained to reasoning behind it and recommend to continue pumping after feedings to protect her supply.   She has her own pumping bra, reviewed hands on pumping, coconut oil use prior pumping, galactagogues and also breastmilk sharing. She voiced that she has a friend who has offered to donate her milk, let her know that we cannot use it while in the NICU (baby is currently on donor milk) but she can certainly re-assess after discharge. Reviewed pros and cons, this LC also provided more colostrum containers and extra pumping logs per her request.  Objective Infant data: Mother's Current Feeding Choice: Breast Milk and Donor Milk Infant feeding assessment Scale for Readiness: 2 Scale for Quality: 2  Maternal data: G2P1011  Vaginal, Spontaneous Significant Breast History:: marked breast assymetry Current breast feeding challenges:: infant separation, mom was readmitted in [facility] Specialty care Pumping frequency: 3 times/24 hours Pumped volume: 2 mL Risk factor for low milk supply:: Possible IGT of R breast Pump: Personal (Two Medela DEBP at  home)  Assessment Infant: LATCH Score: 7  Maternal: Milk volume: Normal  Intervention/Plan Interventions: Breast feeding basics reviewed; Coconut oil; DEBP; Education; Infant Driven Feeding Algorithm education Tools: Pump; Coconut oil Pump Education: Setup, frequency, and cleaning; Milk Storage  Plan of care: Encouraged mom to start pumping consistently every 3 hours, ideally 8 pumping sessions/24 hours but she'll go at her own pace until she's off Mag Breast massage, hand expression and coconut oil were also encouraged prior pumping. She'll also try hands on pumping with her pumping bra She'll call for feeding assist once she feels more stable and well, possibly after she's taken off Mag  FOB and GOB present and very supportive. All questions and concerns answered, family to contact [facility] services PRN.  Consult Status: NICU follow-up NICU Follow-up type: Maternal D/C visit; Verify onset of copious milk; Verify absence of engorgement; Assist with IDF-2 (Mother does not need to pre-pump before breastfeeding)   Rance Smithson Venetia Constable 01/06/2022, 5:05 PM

## 2022-01-06 NOTE — Progress Notes (Signed)
MOB was referred for history of depression/anxiety. * Referral screened out by Clinical Social Worker because none of the following criteria appear to apply: ~ History of anxiety/depression during this pregnancy, or of post-partum depression. ~ Diagnosis of anxiety and/or depression within last 3 years OR * MOB's symptoms currently being treated with medication and/or therapy. MOB has an active Rx for Lexapro and Xanax.  MOB also meets with psychiatry routinely (Dr. Cristy Friedlander) for medication management; no concerns were noted in [facility] records.   MOB's Edinburgh Score is 8  Patient screened out for psychosocial assessment since none of the following apply: Psychosocial stressors documented in mother or baby's chart Gestation less than 32 weeks Code at delivery  Infant with anomalies Please contact the Clinical Social Worker if specific needs arise or  by MOB's request.  Laurey Arrow, MSW, LCSW Clinical Social Work [phone]

## 2022-01-06 NOTE — Progress Notes (Signed)
Post Partum Day 3 SVD Subjective: Patient is complaining of a headache 7 out of 10 even after ibuprofen and 5 mg of percocet. She reports a h/o migraines for which she takes maxalt when not pregnant. She had visual disturbances yesterday but none today. No ruq pain.   Objective: Blood pressure 136/72, pulse 96, temperature 98.7 F (37.1 C), temperature source Oral, resp. rate 17, height 5\' 7"  (1.702 m), weight 122.4 kg, last menstrual period 03/29/2021, SpO2 99 %, unknown if currently breastfeeding.  Physical Exam:  General: alert, cooperative, and mild distress Lungs Clear to ausculation bilaterally without R/R/W CV: RRR Lochia: appropriate Uterine Fundus: firm Incision: NA MS: Hyperreflexic with 2 beats of clonus.  DVT Evaluation: No evidence of DVT seen on physical exam.  Recent Labs    01/05/22 1543 01/06/22 1049  HGB 10.3* 11.4*  HCT 30.8* 33.9*    Assessment/Plan:71 PPD#3 SVD with preeclampsia with severe features based on headache and visual disturbance.  -Start magnesium for seizure prophylaxis for total of 24 hours.  - Continue procardia XL 60 mg daily. Plan to hold for bp less than 110/60 Transfer to [facility] Specialty care.        LOS: 3 days   [facility] 01/06/2022, 1:00 PM

## 2022-01-06 NOTE — Discharge Summary (Signed)
[facility] Discharge Summary   Patient Name:   [name] DOB:     [birth year] MRN:     [id number]  Date of Admission:   01/03/2022 Date of Discharge:  01/09/2022  Admitting diagnosis:    Encounter for induction of labor [Z34.90] Principal Problem:   Gestational hypertension w/o significant proteinuria in 3rd trimester Active Problems:   Encounter for induction of labor   SVD (spontaneous vaginal delivery)  CHTN    Discharge diagnosis:    Encounter for induction of labor [Z34.90] Principal Problem:   Gestational hypertension w/o significant proteinuria in 3rd trimester Active Problems:   Encounter for induction of labor   SVD (spontaneous vaginal delivery)  Term Pregnancy Delivered and CHTN        Additional problems: Asthma, anxiety                                          Post partum procedures: none Augmentation: AROM, Pitocin, and Cytotec Complications: cord avulsion  Hospital course: Induction of Labor With Vaginal Delivery   41 y.o. yo G2P1011 at 100w1dwas admitted to the hospital 01/03/2022 for induction of labor.  Indication for induction:  CHTN .  Patient had an uncomplicated labor course as follows: Membrane Rupture Time/Date: 6:23 PM ,01/03/2022   Delivery Method:Vaginal, Spontaneous  Episiotomy: None  Lacerations:  2nd degree  Details of delivery can be found in separate delivery note.  Patient had a routine postpartum course. Patient is discharged home 01/09/22.  Newborn Data: Birth date:01/03/2022  Birth time:9:10 PM  Gender:Female  Living status:Living  Apgars:6 ,5  Weight:2940 g   Magnesium Sulfate received: No BMZ received: No Rhophylac:No MMR:No T-DaP:Given postpartum Flu: No Transfusion:No                                                               Type of Delivery:  svd Delivering Provider: GArrie Eastern Date of Delivery:  01/03/22  Newborn Data:  Baby Feeding:   Bottle and Breast Disposition:   NICU  Physical  Exam:   Vitals:   01/09/22 0000 01/09/22 0117 01/09/22 0758 01/09/22 1144  BP:  (!) 145/91 (!) 146/82 (!) 149/80  Pulse:  81 82 (!) 107  Resp:  19 18 18   Temp:  98 F (36.7 C) 98.3 F (36.8 C)   TempSrc:  Oral Oral   SpO2: 98%  98% 98%  Weight:      Height:       General: alert, cooperative, and no distress Lochia: appropriate Uterine Fundus: firm Incision: N/A DVT Evaluation: No evidence of DVT seen on physical exam. Negative Homan's sign. No cords or calf tenderness.  Labs: Lab Results  Component Value Date   WBC 12.8 (H) 01/06/2022   HGB 11.4 (L) 01/06/2022   HCT 33.9 (L) 01/06/2022   MCV 90.6 01/06/2022   PLT 180 01/06/2022      Latest Ref Rng & Units 01/06/2022   10:49 AM  CMP  Glucose 70 - 99 mg/dL 91    BUN 6 - 20 mg/dL 8    Creatinine 0.44 - 1.00 mg/dL 0.75    Sodium 135 - 145 mmol/L 138  Potassium 3.5 - 5.1 mmol/L 3.9    Chloride 98 - 111 mmol/L 105    CO2 22 - 32 mmol/L 24    Calcium 8.9 - 10.3 mg/dL 9.1    Total Protein 6.5 - 8.1 g/dL 6.2    Total Bilirubin 0.3 - 1.2 mg/dL 0.6    Alkaline Phos 38 - 126 U/L 122    AST 15 - 41 U/L 73    ALT 0 - 44 U/L 42      Discharge instruction: per After Visit Summary and "Baby and Me Booklet".  After Visit Meds:  Allergies as of 01/09/2022       Reactions   Effexor [venlafaxine Hcl] Other (See Comments)   Pt states make her feel bad   Elavil [amitriptyline] Other (See Comments)   Suicidal effect   Erythromycin Other (See Comments)   Stomach upset   Iohexol     Desc: omnipaque-x-ray dye,hives-itching;--needs premeds-benadryl prior to scan   Latex Hives   Sulfa Antibiotics Other (See Comments)   Anaphlactic        Medication List     STOP taking these medications    aspirin EC 81 MG tablet   labetalol 100 MG tablet Commonly known as: NORMODYNE       TAKE these medications    acetaminophen 325 MG tablet Commonly known as: TYLENOL Take 650 mg by mouth every 6 (six) hours as needed.    cyclobenzaprine 5 MG tablet Commonly known as: FLEXERIL Take 5-10 mg by mouth daily as needed (menstrual cramps).   dibucaine 1 % Oint Commonly known as: NUPERCAINAL Place 1 application. rectally as needed for hemorrhoids.   docusate sodium 250 MG capsule Commonly known as: COLACE Take 250 mg by mouth daily.   escitalopram 10 MG tablet Commonly known as: LEXAPRO Take 2 tablets (20 mg total) by mouth daily.   ibuprofen 600 MG tablet Commonly known as: ADVIL Take 1 tablet (600 mg total) by mouth every 6 (six) hours as needed for cramping.   magnesium oxide 400 MG tablet Commonly known as: MAG-OX Take 400 mg by mouth daily.   multivitamin-prenatal 27-0.8 MG Tabs tablet Take 1 tablet by mouth daily at 12 noon.   NIFEdipine 60 MG 24 hr tablet Commonly known as: ADALAT CC Take 1 tablet (60 mg total) by mouth daily.   oxyCODONE-acetaminophen 5-325 MG tablet Commonly known as: PERCOCET/ROXICET Take 1-2 tablets by mouth every 4 (four) hours as needed for severe pain (Pain scale >6).   ProAir HFA 108 (90 Base) MCG/ACT inhaler Generic drug: albuterol Inhale 2 puffs into the lungs every 4 (four) hours as needed for wheezing.   senna-docusate 8.6-50 MG tablet Commonly known as: Senokot-S Take 2 tablets by mouth daily.        Diet: low salt diet  Activity: Advance as tolerated. Pelvic rest for 6 weeks.   Outpatient follow up:1 week Follow up Appt: Future Appointments  Date Time Provider [place]  04/16/2022  9:30 AM Cottle, Billey Co., MD CP-CP None   Follow up visit: No follow-ups on file.  Postpartum contraception: Not Discussed  01/09/2022 Sanjuana Kava, MD

## 2022-01-07 MED ORDER — ONDANSETRON HCL 40 MG/20ML IJ SOLN
8.0000 mg | Freq: Three times a day (TID) | INTRAMUSCULAR | Status: DC | PRN
Start: 2022-01-07 — End: 2022-01-09
  Administered 2022-01-07: 8 mg via INTRAVENOUS
  Filled 2022-01-07: qty 4

## 2022-01-07 NOTE — Progress Notes (Signed)
MD Progress Note PPD#3  Name: [name] Medical Record Number:  [id number] Date of Birth: 1980-09-20 Date of Service: 01/07/2022  [name] is a 41 y.o. G2P1011 PPD#4 s/p SVD of a baby boy (admitted to NICU). Patient initially was admitted for induction of labor at 39 weeks for gestational HTN (vs. CHTN). During her perinatal course she had labile blood pressures and was transitioned from PO labetalol to PO procardia.   Yesterday, for the first time patient had neurologic symptoms with severe headache. She was placed on IV magnesium sulfate for seizure prophylaxis.  Her blood pressures remained controlled with 60 mg Nifedipine daily.   Patient was seen and examined at bedside earlier.  She denies headache and states it is "resolved".  She now complaints of "heart palpitations".  She denies chest pain, or chest pressures, no dyspnea or shortness of breath.  She denies inspiratory pain.  No fever or chills, no nausea or vomiting.  She has been ambulating w/o difficulty no heavy bleeding, no pain. She is breast feeding the baby in the NICU.   Physical Examination:   Vitals:   01/07/22 1314 01/07/22 1645  BP: 127/60 138/66  Pulse: (!) 113 91  Resp:  18  Temp:  98.7 F (37.1 C)  SpO2: 99% 100%   General appearance - alert, well appearing, and in no distress Lungs CTA Cor RRR Abd  Soft, NT, ND GU:  Uterus firm -1 Ext: No CT, No edema Neuro: No clonus, Normal DTRs bilaterally.   Labs:    Latest Ref Rng & Units 01/06/2022   10:49 AM 01/05/2022    3:43 PM 01/04/2022    4:41 AM  CBC  WBC 4.0 - 10.5 K/uL 12.8   12.1   24.6    Hemoglobin 12.0 - 15.0 g/dL 20.9   47.0   96.2    Hematocrit 36.0 - 46.0 % 33.9   30.8   32.4    Platelets 150 - 400 K/uL 180   157   165         Latest Ref Rng & Units 01/06/2022   10:49 AM 01/05/2022    3:43 PM 01/04/2022    4:41 AM  CMP  Glucose 70 - 99 mg/dL 91   95   836    BUN 6 - 20 mg/dL 8   10   9     Creatinine 0.44 - 1.00 mg/dL   6.29    4.76    Sodium 135 - 145 mmol/L 138   137   137    Potassium 3.5 - 5.1 mmol/L 3.9   3.9   4.1    Chloride 98 - 111 mmol/L 105   108   108    CO2 22 - 32 mmol/L 24   22   23     Calcium 8.9 - 10.3 mg/dL 9.1   8.9   8.9    Total Protein 6.5 - 8.1 g/dL 6.2   5.4   5.1    Total Bilirubin 0.3 - 1.2 mg/dL 0.6   0.3   0.4    Alkaline Phos 38 - 126 U/L 122   87   78    AST 15 - 41 U/L 73   32   24    ALT 0 - 44 U/L 42   22   14       Assessment: HD#4  [redacted]w[redacted]d with new diagnosis of postpartum preeclampsia with severe features s/p 24 hours of magnesium sulfate.  Patient with new heart palpitations and sinus tachycardia. No evidence of volume depletion or anemia as Hb is stable at 11.  Blood pressure low/ norma  Plan: EKG ordered and done - normal sinus tach Will decrease procardia to 30 mg daily Continue to monitor patient closely  [employer]

## 2022-01-07 NOTE — Lactation Note (Signed)
This note was copied from a baby's chart.  NICU Lactation Consultation Note  Patient Name: [name] [id number] Date: 01/07/2022 Age:40 days  Subjective Reason for consult: Follow-up assessment; Primapara; 1st time breastfeeding; NICU baby; Term; Other (Comment) (AMA)  Visited with mom of 34 hours old FT NICU female, she's a P1 and reports that since she's been transferred to [facility] Specialty care, she's been pumping consistently, every 2-3 hours. FOB is extremely supportive, he has kept a log with all pumping/volumes MOB did the last 24 hours, praised parents for their efforts. [name] voiced that her nipples were cracking, small cracks (no bleeding) and crusty nipple surface were noted upon examination; she said they only hurt while pumping but no pain/discomfort at rest. Let [name] know that we can get some breast shells if the rubbing of her clothes start bothering her, so nipples are left to air dry. [name] had more questions today. Reviewed pump settings, engorgement, plugged ducts, mastitis and infant feeding. She would like to challenge at the breast again once she's off Mag.  Objective Infant data: Mother's Current Feeding Choice: Breast Milk and Donor Milk Infant feeding assessment Scale for Readiness: 1 Scale for Quality: 3  Maternal data: G2P1011  Vaginal, Spontaneous Current breast feeding challenges:: infant separation, mom was readmitted in [facility] Specialty care Pumping frequency: 9 times/24 hours Pumped volume: 2 mL (2-3 ml) Pump: Personal (Two Medela DEBP at home)  Assessment Infant: LATCH Score: 7  Maternal: Milk volume: Low  Intervention/Plan Interventions: Breast feeding basics reviewed; Coconut oil; DEBP; Education Tools: Pump; Coconut oil Pump Education: Setup, frequency, and cleaning; Milk Storage  Plan of care: Encouraged mom to continue pumping consistently every 3 hours, ideally 8 pumping sessions/24 hours  Breast massage, hand  expression and coconut oil were also encouraged prior pumping; she'll use a bit extra coconut oil inside the flange too for added lubrication. She'll also continue doing hands on pumping with her pumping bra She'll call for feeding assist once she feels more stable and well, possibly after she's taken off Mag   FOB present and very supportive. All questions and concerns answered, family to contact [facility] services PRN.  Consult Status: NICU follow-up NICU Follow-up type: Maternal D/C visit; Verify onset of copious milk; Verify absence of engorgement; Weekly NICU follow up; Assist with IDF-2 (Mother does not need to pre-pump before breastfeeding)   Wandell Scullion S Ahmiya Abee 01/07/2022, 1:36 PM

## 2022-01-08 LAB — SURGICAL PATHOLOGY

## 2022-01-08 MED ORDER — NIFEDIPINE ER OSMOTIC RELEASE 30 MG PO TB24
30.0000 mg | ORAL_TABLET | Freq: Every day | ORAL | Status: DC
  Administered 2022-01-08: 30 mg via ORAL
  Filled 2022-01-08: qty 1

## 2022-01-08 MED ORDER — NIFEDIPINE ER OSMOTIC RELEASE 30 MG PO TB24
30.0000 mg | ORAL_TABLET | Freq: Once | ORAL | Status: AC
  Administered 2022-01-08: 30 mg via ORAL
  Filled 2022-01-08: qty 1

## 2022-01-08 MED ORDER — NIFEDIPINE ER OSMOTIC RELEASE 60 MG PO TB24
60.0000 mg | ORAL_TABLET | Freq: Every day | ORAL | Status: DC
  Administered 2022-01-09: 60 mg via ORAL
  Filled 2022-01-08: qty 1

## 2022-01-08 NOTE — Progress Notes (Signed)
Post Partum Day 5 Subjective: voiding, tolerating PO, + flatus, and headache and occas palpitations  Objective: Blood pressure 140/81, pulse (!) 110, temperature 98.4 F (36.9 C), temperature source Oral, resp. rate 19, height 5\' 7"  (1.702 m), weight 122.4 kg, last menstrual period 03/29/2021, SpO2 100 %, unknown if currently breastfeeding.  Physical Exam:  General: alert and no distress Lochia: appropriate Uterine Fundus: firm Incision: n/a DVT Evaluation: No evidence of DVT seen on physical exam.  Recent Labs    01/05/22 1543 01/06/22 1049  HGB 10.3* 11.4*  HCT 30.8* 33.9*    Assessment/Plan: Plan for discharge tomorrow Pt overall doing ok but still with HA and occas palpitations There was concern palpitations was d/t procardia so dose was decreased to 30mg  daily Pt reports sxs on the labetalol previously as well BPs controlled better on 60mg  procardia daily so will give another 30mg  now and do another trial of 60mg  tomorrow Pt is agreeable to plan and said her psychiatrist recently increased meds and sxs could be related to not having been on high enough dose Percocet controls headaches and she has other meds at home as well Pt would like to be observed overnight and after she gets the 60mg  tomorrow and if doing well discharge at that time    LOS: 5 days   03/08/22 01/08/2022, 1:26 PM

## 2022-01-08 NOTE — Lactation Note (Signed)
This note was copied from a baby's chart.  NICU Lactation Consultation Note  Patient Name: [name] [id number] Date: 01/08/2022 Age:40 days   Subjective Reason for consult: Follow-up assessment; Mother's request; NICU baby; Term  Lactation paged to assist with breastfeeding. We placed baby in biological nurturing position and noted vigorous cueing. We helped him move down to the left breast. I assisted with latching baby. I instructed mom to HE milk, and I assisted with teacup hold with breast tissue. Baby latched with noted negative pressure felt, and we noted rhythmic suckling sequences.  After several minutes, he released the breast and began to cry. SLP assisted with bottle feeding to calm baby.  Lactation to return for feeding this afternoon to assist again and discuss her plan for discharge.   Objective Infant data: Mother's Current Feeding Choice: Breast Milk and Donor Milk  Infant feeding assessment Scale for Readiness: 1 Scale for Quality: 2   Maternal data: G2P1011  Vaginal, Spontaneous  Current breast feeding challenges:: NICU   Pumping frequency: 9 times/24 hours Pumped volume: 5 mL (5 mls)   Pump: Personal (Two Medela DEBP at home)  Assessment Infant: LATCH Score: 9  Feeding Status: Ad lib   Maternal: Milk volume: Low   Intervention/Plan Interventions: Breast feeding basics reviewed; Assisted with latch; Skin to skin; Adjust position; Education  Tools: Pump; Coconut oil Pump Education: Setup, frequency, and cleaning; Milk Storage  Plan: Consult Status: Follow-up  NICU Follow-up type: Assist with IDF-2 (Mother does not need to pre-pump before breastfeeding)    Walker Shadow 01/08/2022, 12:29 PM

## 2022-01-08 NOTE — Progress Notes (Signed)
CSW received consult for hx of Anxiety and Depression.  CSW met with MOB to offer support and complete assessment.    When CSW arrived to infant's bedside in room 306 MOB was pumping and FOB was holding infant.  CSW offered to return at a later time and MOB declined. The couple was polite, easy to engage, and receptive to meeting with CSW. CSW explained CSW's role and without prompting, MOB acknowledged requesting to speak with CSW.  Per CSW she has a therapist and her symptoms are managed with medication however she is concerned about PMADs and post delivery trauma.  MOB shared that she would prefer to have a resource that specialize in PMADs if any symtoms arise. CSW validated and normalized MOB's thoughts and feeling and discussed other emotions that MOB may experience during the postpartum period.  CSW provided MOB with local resources and  education regarding the baby blues period vs. perinatal mood disorders, discussed treatment and gave resources for mental health follow up if concerns arise.  CSW recommends self-evaluation during the postpartum time period using the New Mom Checklist from Postpartum Progress and encouraged MOB to contact a medical professional if symptoms are noted at any time.  MOB presented with insight and awareness and did not demonstrate any acuate MH symptoms.  MOB and FOB shared having a good support team and they expressed feeling comfortable seeking additional help if needed. CSW assessed for safety and MOB denied SI and HI.  She communicated feeling attached and bonded with infant.   CSW provided review of Sudden Infant Death Syndrome (SIDS) precautions.    The couple reported feeling well informed by NICU team and they denied having any questions or concerns.   CSW will continue to offer resources and supports to family while infant remains in NICU.    Laurey Arrow, MSW, LCSW Clinical Social Work [phone]

## 2022-01-08 NOTE — Lactation Note (Signed)
This note was copied from a baby's chart.  NICU Lactation Consultation Note  Patient Name: Boy Grabriela Scheller [id number] Date: 01/08/2022 Age:40 days   Subjective Reason for consult: Follow-up assessment  Lactation followed up with Ms. Krauss. We placed baby Alroy Dust STS and he began to cry inconsolably. We provided approximately 17 mls of EBM/DBM by bottle until he became calm. Then I assisted with latching baby to the left breast in cross cradle hold. Ms. Hamor was able to repeat instructions back well and latched baby with minimal assistance.  After about 6-7 minutes of breastfeeding, baby fell asleep. We moved baby to FOB's lap, and he supplemented with bottle.  Going forward, I recommended offering more volume by bottle prior to latching baby to prevent double-bottle feeding. SLP, Abran Richard, provided some recommendations via [place].  I recommended that Ms. Essick post-pump. Lactation to follow up tomorrow.  Objective Infant data: Mother's Current Feeding Choice: Breast Milk and Donor Milk  Infant feeding assessment Scale for Readiness: 1 Scale for Quality: 1  Maternal data: G2P1011  Vaginal, Spontaneous Current breast feeding challenges:: NICU  Pumping frequency: 9 times/24 hours Pumped volume: 5 mL   Pump: Personal (Two Medela DEBP at home)  Assessment Infant: LATCH Score: 9  Feeding Status: Ad lib   Maternal: Milk volume: Low   Intervention/Plan Interventions: Breast feeding basics reviewed; Assisted with latch; Skin to skin; Hand express; Adjust position; Support pillows; Education  Tools: Other (comment) (syringe) Pump Education: Other (comment) (post pump after breastfeeding)  Plan: Consult Status: NICU follow-up  NICU Follow-up type: Assist with IDF-2 (Mother does not need to pre-pump before breastfeeding)    Lenore Manner 01/08/2022, 5:00 PM

## 2022-01-09 MED ORDER — DIBUCAINE (PERIANAL) 1 % EX OINT: 1.0000 "application " | TOPICAL_OINTMENT | CUTANEOUS | 3 refills | Status: AC | PRN

## 2022-01-09 NOTE — Lactation Note (Signed)
This note was copied from a Marissa's chart.  NICU Lactation Consultation Note  Patient Name: [name] [id number] Date: 01/09/2022 Age:41 years   Subjective Reason for consult: Follow-up assessment; Mother's request  Lactation followed up with [name] and [name]. I assisted with breastfeeding. They bottle fed Marissa initially to help him calm down. Then I assisted her and her husband with latching Marissa to the left breast in cradle hold. FOB assisted with reinforcement of formula with curved-tip syringe at the breast. Marissa latches well, but becomes frustrated. Mom reinforced with bottle.  [name] anticipates discharge tomorrow; [name] will likely be discharged soon. Anticipate circumcision procedure tomorrow.  [name] requests follow up from SLP prior to discharge. I sent a message to the team.  She will follow up with [facility] and is aware of lactation services there.  Objective Infant data: Mother's Current Feeding Choice: Breast Milk and Formula  Infant feeding assessment Scale for Readiness: 1 Scale for Quality: 3  Maternal data: G2P1011  Vaginal, Spontaneous  Current breast feeding challenges:: NICU; working on breastfeeding   Pumped volume: 5 mL   Pump: Personal (Medela pump in style)  Assessment Infant: LATCH Score: 7  Feeding Status: Ad lib   Maternal: No data recorded  Intervention/Plan Interventions: Education  Tools: Bottle Pump Education: Other (comment) (post pump after breastfeeding)  Plan: Consult Status: NICU follow-up  NICU Follow-up type: Assist with IDF-2 (Mother does not need to pre-pump before breastfeeding)    Walker Shadow 01/09/2022, 12:52 PM

## 2022-01-09 NOTE — Progress Notes (Signed)
Post Partum Day 6 Subjective: Patient states she "feels much better" She denies heart palpitations or headache.  She denies chest pain or shortness of breath  Objective: Blood pressure (!) 149/80, pulse (!) 107, temperature 98.3 F (36.8 C), temperature source Oral, resp. rate 18, height 5\' 7"  (1.702 m), weight 122.4 kg, last menstrual period 03/29/2021, SpO2 98 %, unknown if currently breastfeeding.  Physical Exam:  General: alert, cooperative, and no distress Lochia: appropriate Uterine Fundus: firm perineum: healing well DVT Evaluation: No evidence of DVT seen on physical exam. Negative Homan's sign. No cords or calf tenderness.  No results for input(s): HGB, HCT in the last 72 hours.  Assessment/Plan: Discharge home Circumcision for baby boy in NICU tomorrow   LOS: 6 days   03/31/2021 01/09/2022, 2:36 PM

## 2022-01-10 ENCOUNTER — Ambulatory Visit: Payer: Self-pay

## 2022-01-10 NOTE — Lactation Note (Signed)
This note was copied from a baby's chart.  NICU Lactation Consultation Note  Patient Name: Boy Janeann Paisley [id number] Date: 01/10/2022 Age:40 days   Subjective Reason for consult: Follow-up assessment; Other (Comment) (infant d/c) Mother and infant to d/c today. Mother with questions about SNS and feeding schedule. Mother plans to f/u with LC at [facility].  Mother is comfortable with current poc of bottle feeding first followed by comfort nursing.   Objective Infant data: Mother's Current Feeding Choice: Breast Milk and Formula  Infant feeding assessment Scale for Readiness: 1 Scale for Quality: 2   Maternal data: G2P1011  Vaginal, Spontaneous  Current breast feeding challenges:: NICU; working on breastfeeding  Pumping frequency: q3h Pumped volume: 10 mL   Pump: Personal (Medela pump in style)  Assessment Infant: LATCH Score: 7   Maternal: Milk volume: Low Mother with appearance of IGT  Intervention/Plan Interventions: Education  Tools: Bottle  Plan: Consult Status: Complete  NICU Follow-up type: Assist with IDF-2 (Mother does not need to pre-pump before breastfeeding)  Mother to continue pumping q3 and offering breast p bottle.  Elder Negus 01/10/2022, 10:51 AM

## 2022-01-14 ENCOUNTER — Telehealth: Payer: Self-pay

## 2022-01-14 ENCOUNTER — Telehealth (HOSPITAL_COMMUNITY): Payer: Self-pay | Admitting: *Deleted

## 2022-01-14 NOTE — Telephone Encounter (Addendum)
Mom reports feeling good. Still having issues with BP and has requested referral to cardiologist. Having some constipation issues as well, but treating symptoms presently. No other concerns about herself at this time. EPDS=7 [facility] score=8) Has requested referral for emotional health from baby's nicu stay. Mom reports baby is doing well. Peds appt completed this morning. Feeding, peeing, and pooping without difficulty. Safe sleep reviewed. Mom reports no concerns about baby at present.  Odis Hollingshead, RN 01-14-2022 at 12:35pm

## 2022-02-22 ENCOUNTER — Ambulatory Visit: Payer: No Typology Code available for payment source | Admitting: Cardiology

## 2022-02-28 NOTE — Progress Notes (Signed)
Cardio-Obstetrics Clinic  New Evaluation  Date:  03/01/2022   ID:  [name], DOB 1981-06-24, MRN [id number]  PCP:  Laurell Josephs, MD (Inactive)   [facility] Providers Cardiologist:  None  Electrophysiologist:  None     Referring MD: Nigel Bridgeman, CNM   Chief Complaint: HTN  History of Present Illness:    [name] is a 41 y.o. female [G2P1011] who is being seen today for the evaluation of pre-eclampsia, HTN at the request of Nigel Bridgeman, CNM.   Today, the patient is currently 8w postpartum.   The patient states that about 8 years ago, she had elevated blood pressures and was started on amlodipine. Over the years, she was able to be weaned off. Prior to becoming pregnant, she developed Bps 130s/90s and was started on labetalol. This was increased during pregnancy but then she was able to be weaned off prior to delivery. Notably, she felt horrible with the labetalol like she was "hypoglycemic." Delivery was complicated by pre-eclampsia and required IV mag and IV labetalol pushes. She was ultimately transitioned to nifedipine on discharge and was temporarily started on hydralazine which she has been weaned off.   Since being home, blood pressures have been 120-140s/80-90s on nifedipine 30mg  daily. Currently breast feeding. No chest pain, LE edema, orthopnea, PND or SOB.   Prior CV Studies Reviewed: The following studies were reviewed today: No CV studies.  Past Medical History:  Diagnosis Date   Abnormal Pap smear    cin-1   Asthma    Depression    Hx of migraines    Hypertension    PRE    IBS (irritable bowel syndrome)    Pregnancy induced hypertension    Vaginal Pap smear, abnormal     Past Surgical History:  Procedure Laterality Date   CHOLECYSTECTOMY     COLPOSCOPY     WISDOM TOOTH EXTRACTION        OB History     Gravida  2   Para  1   Term  1   Preterm      AB  1   Living  1      SAB      IAB  1   Ectopic       Multiple  0   Live Births  1               Current Medications: Current Meds  Medication Sig   amLODipine (NORVASC) 5 MG tablet Take 1 tablet (5 mg total) by mouth daily.   cyclobenzaprine (FLEXERIL) 5 MG tablet Take 5-10 mg by mouth daily as needed (menstrual cramps).    dibucaine (NUPERCAINAL) 1 % OINT Place 1 application. rectally as needed for hemorrhoids.   escitalopram (LEXAPRO) 10 MG tablet Take 2 tablets (20 mg total) by mouth daily.   Prenatal Vit-Fe Fumarate-FA (MULTIVITAMIN-PRENATAL) 27-0.8 MG TABS tablet Take 1 tablet by mouth daily at 12 noon.   PROAIR HFA 108 (90 BASE) MCG/ACT inhaler Inhale 2 puffs into the lungs every 4 (four) hours as needed for wheezing.    [DISCONTINUED] NIFEdipine (ADALAT CC) 60 MG 24 hr tablet Take 1 tablet (60 mg total) by mouth daily. (Patient taking differently: Take 60 mg by mouth daily. Per patient taking 1/2 tablet daily)     Allergies:   Effexor [venlafaxine hcl], Elavil [amitriptyline], Erythromycin, Iohexol, Latex, and Sulfa antibiotics   Social History   Socioeconomic History   Marital status: Married    Spouse name:  Wayne   Number of children: Not on file   Years of education: Not on file   Highest education level: Not on file  Occupational History   Occupation: RN  Tobacco Use   Smoking status: Never   Smokeless tobacco: Never  Vaping Use   Vaping Use: Never used  Substance and Sexual Activity   Alcohol use: Not Currently    Comment: 3-4 beers per week   Drug use: No   Sexual activity: Yes    Birth control/protection: None  Other Topics Concern   Not on file  Social History Narrative   Not on file   Social Determinants of Health   Financial Resource Strain: Not on file  Food Insecurity: Not on file  Transportation Needs: Not on file  Physical Activity: Not on file  Stress: Not on file  Social Connections: Not on file      Family History  Problem Relation Age of Onset   Cancer Father        RENAL CELL  CARCINOMA   Hyperlipidemia Mother    Thyroid disease Mother    Thyroid disease Maternal Grandmother    Kidney disease Maternal Grandmother    Heart disease Maternal Grandmother        A FIB   Stroke Paternal Grandmother    Diabetes Paternal Grandmother    Hypertension Brother       ROS:   Please see the history of present illness.     All other systems reviewed and are negative.   Labs/EKG Reviewed:    EKG:   EKG 01/17/22: sinus tachycardia  Recent Labs: 01/06/2022: ALT 42; BUN 8; Creatinine, Ser 0.75; Hemoglobin 11.4; Platelets 180; Potassium 3.9; Sodium 138   Recent Lipid Panel No results found for: "CHOL", "TRIG", "HDL", "CHOLHDL", "LDLCALC", "LDLDIRECT"  Physical Exam:    VS:  BP 126/90   Pulse 81   Ht 5\' 7"  (1.702 m)   Wt 256 lb 6.4 oz (116.3 kg)   LMP 03/29/2021   SpO2 97%   BMI 40.16 kg/m     Wt Readings from Last 3 Encounters:  03/01/22 256 lb 6.4 oz (116.3 kg)  01/03/22 269 lb 14.4 oz (122.4 kg)  03/03/20 (!) 245 lb (111.1 kg)     GEN:  Well nourished, well developed in no acute distress HEENT: Normal NECK: No JVD; No carotid bruits CARDIAC: RRR, no murmurs, rubs, gallops RESPIRATORY:  Clear to auscultation without rales, wheezing or rhonchi  ABDOMEN: Soft, non-tender, non-distended MUSCULOSKELETAL:  No edema; No deformity  SKIN: Warm and dry NEUROLOGIC:  Alert and oriented x 3 PSYCHIATRIC:  Normal affect    Risk Assessment/Risk Calculators:   {  ASSESSMENT & PLAN:    #Gestational HTN: #Post-partum HTN: Patient developed HTN during delivery that persisted in the postpartum period. She required IV Mag and labetalol. No proteinuria. She is currently on nifedipine 30mg  daily with BP 120-140s at home. Due to significant migraines with the nifedipine, will transition her to amlodipine and monitor response. -Change nifedipine to amlodipine 5mg  daily -Goal BP<130/90 -Discussed to call 03/05/20 if she needs adjustments in meds; otherwise will see back in 6  months  Patient Instructions  Medication Instructions:   STOP TAKING NIFEDIPINE NOW  START TAKING AMLODIPINE 5 MG BY MOUTH DAILY  *If you need a refill on your cardiac medications before your next appointment, please call your pharmacy*    Follow-Up:  6 MONTHS WITH DR. HERE AT [facility]   Important Information About  Sugar         Dispo:  No follow-ups on file.   Medication Adjustments/Labs and Tests Ordered: Current medicines are reviewed at length with the patient today.  Concerns regarding medicines are outlined above.  Tests Ordered: No orders of the defined types were placed in this encounter.  Medication Changes: Meds ordered this encounter  Medications   amLODipine (NORVASC) 5 MG tablet    Sig: Take 1 tablet (5 mg total) by mouth daily.    Dispense:  90 tablet    Refill:  3

## 2022-03-01 ENCOUNTER — Ambulatory Visit (INDEPENDENT_AMBULATORY_CARE_PROVIDER_SITE_OTHER): Payer: No Typology Code available for payment source | Admitting: Cardiology

## 2022-03-01 ENCOUNTER — Encounter: Payer: Self-pay | Admitting: Cardiology

## 2022-03-01 VITALS — BP 126/90 | HR 81 | Ht 67.0 in | Wt 256.4 lb

## 2022-03-01 DIAGNOSIS — O133 Gestational [pregnancy-induced] hypertension without significant proteinuria, third trimester: Secondary | ICD-10-CM | POA: Diagnosis not present

## 2022-03-01 DIAGNOSIS — O165 Unspecified maternal hypertension, complicating the puerperium: Secondary | ICD-10-CM

## 2022-03-01 MED ORDER — AMLODIPINE BESYLATE 5 MG PO TABS: 5.0000 mg | ORAL_TABLET | Freq: Every day | ORAL | 3 refills | Status: DC

## 2022-03-01 NOTE — Patient Instructions (Signed)
Medication Instructions:   STOP TAKING NIFEDIPINE NOW  START TAKING AMLODIPINE 5 MG BY MOUTH DAILY  *If you need a refill on your cardiac medications before your next appointment, please call your pharmacy*    Follow-Up:  6 MONTHS WITH DR. Shari Prows HERE AT [facility]   Important Information About Sugar

## 2022-03-06 ENCOUNTER — Other Ambulatory Visit: Payer: Self-pay | Admitting: Psychiatry

## 2022-03-06 DIAGNOSIS — F3281 Premenstrual dysphoric disorder: Secondary | ICD-10-CM

## 2022-03-06 DIAGNOSIS — F4310 Post-traumatic stress disorder, unspecified: Secondary | ICD-10-CM

## 2022-03-06 DIAGNOSIS — F331 Major depressive disorder, recurrent, moderate: Secondary | ICD-10-CM

## 2022-03-13 ENCOUNTER — Other Ambulatory Visit: Payer: Self-pay

## 2022-03-13 ENCOUNTER — Telehealth: Payer: Self-pay | Admitting: Psychiatry

## 2022-03-13 DIAGNOSIS — F331 Major depressive disorder, recurrent, moderate: Secondary | ICD-10-CM

## 2022-03-13 DIAGNOSIS — F4310 Post-traumatic stress disorder, unspecified: Secondary | ICD-10-CM

## 2022-03-13 DIAGNOSIS — F3281 Premenstrual dysphoric disorder: Secondary | ICD-10-CM

## 2022-03-13 MED ORDER — ESCITALOPRAM OXALATE 10 MG PO TABS: 10.0000 mg | ORAL_TABLET | Freq: Every day | ORAL | 0 refills | Status: DC

## 2022-03-13 NOTE — Telephone Encounter (Addendum)
Pt called reporting she has had her son and he is 10 weeks. She has had her first post pardon cycle for 2 weeks. Asking if she can start Lexapro as before she got pregnant. Had to move out apt from 9/12 to 11/1 Provider vacation. Pt asking not to have to wait for next apt to start with previous dose. On canc list. Pt # [phone]

## 2022-03-13 NOTE — Telephone Encounter (Signed)
Yes, please send in Lexapro 10 mg 1/2 daily for 1 week then 1 daily.  #90,

## 2022-03-13 NOTE — Telephone Encounter (Signed)
Rx sent 

## 2022-03-13 NOTE — Telephone Encounter (Signed)
Pt is experiencing postpartum depression and wants to restart lexapro

## 2022-03-14 ENCOUNTER — Other Ambulatory Visit: Payer: Self-pay

## 2022-03-14 ENCOUNTER — Telehealth: Payer: Self-pay | Admitting: Psychiatry

## 2022-03-14 DIAGNOSIS — F4310 Post-traumatic stress disorder, unspecified: Secondary | ICD-10-CM

## 2022-03-14 DIAGNOSIS — F3281 Premenstrual dysphoric disorder: Secondary | ICD-10-CM

## 2022-03-14 DIAGNOSIS — F331 Major depressive disorder, recurrent, moderate: Secondary | ICD-10-CM

## 2022-03-14 MED ORDER — ESCITALOPRAM OXALATE 10 MG PO TABS: ORAL_TABLET | ORAL | 1 refills | Status: DC

## 2022-03-14 NOTE — Telephone Encounter (Signed)
Patient lvm requesting clarification on medication dosage.  See telephone encounter 03/13/22  Contact information # [phone]

## 2022-03-14 NOTE — Telephone Encounter (Signed)
Rx sent 

## 2022-03-14 NOTE — Telephone Encounter (Signed)
There was some confusion from the last note.Pt meant she had started her period for the first time after having her baby ,in the previous message I told you she was experiencing post partum depression and that is incorrect.She was taking the lexapro 20 mg all along she never stopped med.So what she is asking is if she can go back to what she was taking before Lexapro 10 mg daily and 20 mg 5 days before cycle to manage PMDD

## 2022-03-14 NOTE — Telephone Encounter (Signed)
Yes, she can resume using the extra Lexapro to manage PMDD on prn basis.

## 2022-03-25 ENCOUNTER — Encounter: Payer: Self-pay | Admitting: Cardiology

## 2022-04-16 ENCOUNTER — Ambulatory Visit: Payer: No Typology Code available for payment source | Admitting: Psychiatry

## 2022-05-23 ENCOUNTER — Encounter: Payer: Self-pay | Admitting: Physical Therapy

## 2022-06-05 ENCOUNTER — Encounter: Payer: Self-pay | Admitting: Psychiatry

## 2022-06-05 ENCOUNTER — Other Ambulatory Visit: Payer: Self-pay | Admitting: Psychiatry

## 2022-06-05 ENCOUNTER — Ambulatory Visit (INDEPENDENT_AMBULATORY_CARE_PROVIDER_SITE_OTHER): Payer: No Typology Code available for payment source | Admitting: Psychiatry

## 2022-06-05 DIAGNOSIS — F411 Generalized anxiety disorder: Secondary | ICD-10-CM

## 2022-06-05 DIAGNOSIS — F331 Major depressive disorder, recurrent, moderate: Secondary | ICD-10-CM

## 2022-06-05 DIAGNOSIS — R5382 Chronic fatigue, unspecified: Secondary | ICD-10-CM

## 2022-06-05 DIAGNOSIS — F4310 Post-traumatic stress disorder, unspecified: Secondary | ICD-10-CM

## 2022-06-05 DIAGNOSIS — F5105 Insomnia due to other mental disorder: Secondary | ICD-10-CM

## 2022-06-05 DIAGNOSIS — F3281 Premenstrual dysphoric disorder: Secondary | ICD-10-CM

## 2022-06-05 MED ORDER — ALPRAZOLAM 0.5 MG PO TABS: ORAL_TABLET | ORAL | 0 refills | Status: DC

## 2022-06-05 NOTE — Telephone Encounter (Signed)
Appt today
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
## 2022-06-06 ENCOUNTER — Ambulatory Visit
Payer: No Typology Code available for payment source | Attending: Obstetrics and Gynecology | Admitting: Physical Therapy

## 2022-06-06 ENCOUNTER — Other Ambulatory Visit: Payer: Self-pay

## 2022-06-06 DIAGNOSIS — R279 Unspecified lack of coordination: Secondary | ICD-10-CM

## 2022-06-06 DIAGNOSIS — R293 Abnormal posture: Secondary | ICD-10-CM

## 2022-06-06 DIAGNOSIS — M62838 Other muscle spasm: Secondary | ICD-10-CM

## 2022-06-06 DIAGNOSIS — M629 Disorder of muscle, unspecified: Secondary | ICD-10-CM | POA: Diagnosis not present

## 2022-06-06 DIAGNOSIS — M6281 Muscle weakness (generalized): Secondary | ICD-10-CM
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
## 2022-06-06 NOTE — Patient Instructions (Addendum)
 *  balloon breathing: make open fist with hand, breath out into fist like blowing up a balloon, instead of pushing for stool to empty.     Types of Fiber   There are two main types of fiber:  insoluble and soluble.  Both of these types can prevent and relieve constipation and diarrhea, although some people find one or the other to be more easily digested.  This handout details information about both types of fiber. recommended 25-35 grams of fiber per day,  average 9-12 grams per meal   key is a balance between soluble and insoluble  Insoluble Fiber        Functions of Insoluble Fiber moves bulk through the intestines  controls and balances the pH (acidity) in the intestines   This type of fiber should be avoided or reduced if you have soft, frequent bowel movements or leakage      Benefits of Insoluble Fiber promotes regular bowel movement and prevents constipation  removes fecal waste through colon in less time  keeps an optimal pH in intestines to prevent microbes from producing cancer substances, therefore preventing colon cancer        Food Sources of Insoluble Fiber whole-wheat products  wheat bran "miller's bran" corn bran  flax seed or other seeds vegetables such as green beans, broccoli, cauliflower and potato skins  fruit skins and root vegetable skins  popcorn brown rice  Soluble Fiber( Types 5,6,7)       Functions of Soluble Fiber  holds water in the colon to bulk and soften the stool prolongs stomach emptying time so that sugar is released and absorbed more slowly  prevent leakage associated with soft, frequent bowel movements.        Benefits of Soluble Fiber lowers total cholesterol and LDL cholesterol (the bad cholesterol) therefore reducing the risk of heart disease  regulates blood sugar for people with diabetes       Food Sources of Soluble Fiber oat/oat bran dried beans and peas  nuts  barley  flax seed or other seeds fruits such as oranges,  pears, peaches, and apples  vegetables such as carrots  psyllium husk  prunes

## 2022-06-20 ENCOUNTER — Ambulatory Visit: Payer: No Typology Code available for payment source | Admitting: Physical Therapy

## 2022-06-20 DIAGNOSIS — M6281 Muscle weakness (generalized): Secondary | ICD-10-CM

## 2022-06-20 DIAGNOSIS — M629 Disorder of muscle, unspecified: Secondary | ICD-10-CM | POA: Diagnosis not present

## 2022-06-20 DIAGNOSIS — R279 Unspecified lack of coordination: Secondary | ICD-10-CM

## 2022-06-20 DIAGNOSIS — R293 Abnormal posture: Secondary | ICD-10-CM
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
## 2022-07-04 ENCOUNTER — Ambulatory Visit: Payer: No Typology Code available for payment source | Admitting: Physical Therapy

## 2022-07-04 DIAGNOSIS — R279 Unspecified lack of coordination: Secondary | ICD-10-CM

## 2022-07-04 DIAGNOSIS — M6281 Muscle weakness (generalized): Secondary | ICD-10-CM

## 2022-07-04 DIAGNOSIS — M629 Disorder of muscle, unspecified: Secondary | ICD-10-CM | POA: Diagnosis not present

## 2022-07-04 DIAGNOSIS — R293 Abnormal posture: Secondary | ICD-10-CM
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
## 2022-07-18 ENCOUNTER — Ambulatory Visit
Payer: No Typology Code available for payment source | Attending: Obstetrics and Gynecology | Admitting: Physical Therapy

## 2022-07-18 DIAGNOSIS — M6281 Muscle weakness (generalized): Secondary | ICD-10-CM | POA: Diagnosis present

## 2022-07-18 DIAGNOSIS — R279 Unspecified lack of coordination: Secondary | ICD-10-CM | POA: Insufficient documentation
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
## 2022-07-18 NOTE — Patient Instructions (Signed)

## 2022-08-01 ENCOUNTER — Encounter: Payer: No Typology Code available for payment source | Admitting: Physical Therapy

## 2022-08-15 ENCOUNTER — Ambulatory Visit: Payer: No Typology Code available for payment source | Attending: Obstetrics and Gynecology

## 2022-08-15 DIAGNOSIS — M62838 Other muscle spasm: Secondary | ICD-10-CM | POA: Diagnosis present

## 2022-08-15 DIAGNOSIS — M6281 Muscle weakness (generalized): Secondary | ICD-10-CM | POA: Diagnosis present

## 2022-08-15 DIAGNOSIS — R293 Abnormal posture: Secondary | ICD-10-CM | POA: Diagnosis present

## 2022-08-15 DIAGNOSIS — R279 Unspecified lack of coordination: Secondary | ICD-10-CM | POA: Diagnosis present
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
## 2022-08-19 ENCOUNTER — Other Ambulatory Visit: Payer: Self-pay | Admitting: Psychiatry

## 2022-08-19 DIAGNOSIS — F4310 Post-traumatic stress disorder, unspecified: Secondary | ICD-10-CM

## 2022-08-19 DIAGNOSIS — F331 Major depressive disorder, recurrent, moderate: Secondary | ICD-10-CM

## 2022-08-19 DIAGNOSIS — F3281 Premenstrual dysphoric disorder: Secondary | ICD-10-CM

## 2022-08-29 ENCOUNTER — Ambulatory Visit: Payer: No Typology Code available for payment source

## 2022-08-29 DIAGNOSIS — M6281 Muscle weakness (generalized): Secondary | ICD-10-CM

## 2022-08-29 DIAGNOSIS — M62838 Other muscle spasm: Secondary | ICD-10-CM

## 2022-08-29 DIAGNOSIS — R293 Abnormal posture: Secondary | ICD-10-CM

## 2022-08-29 DIAGNOSIS — R279 Unspecified lack of coordination: Secondary | ICD-10-CM
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
## 2022-09-26 ENCOUNTER — Ambulatory Visit: Payer: No Typology Code available for payment source | Attending: Obstetrics and Gynecology

## 2022-09-26 DIAGNOSIS — M62838 Other muscle spasm: Secondary | ICD-10-CM | POA: Diagnosis present

## 2022-09-26 DIAGNOSIS — R279 Unspecified lack of coordination: Secondary | ICD-10-CM | POA: Diagnosis present

## 2022-09-26 DIAGNOSIS — M6281 Muscle weakness (generalized): Secondary | ICD-10-CM | POA: Diagnosis present

## 2022-09-26 DIAGNOSIS — R293 Abnormal posture: Secondary | ICD-10-CM
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
## 2022-10-03 ENCOUNTER — Ambulatory Visit: Payer: No Typology Code available for payment source

## 2022-10-03 DIAGNOSIS — R279 Unspecified lack of coordination: Secondary | ICD-10-CM

## 2022-10-03 DIAGNOSIS — M62838 Other muscle spasm: Secondary | ICD-10-CM

## 2022-10-03 DIAGNOSIS — R293 Abnormal posture: Secondary | ICD-10-CM

## 2022-10-03 DIAGNOSIS — M6281 Muscle weakness (generalized): Secondary | ICD-10-CM
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
## 2022-10-10 ENCOUNTER — Ambulatory Visit: Payer: No Typology Code available for payment source | Admitting: Psychiatry

## 2022-10-10 ENCOUNTER — Encounter: Payer: Self-pay | Admitting: Psychiatry

## 2022-10-10 DIAGNOSIS — F3281 Premenstrual dysphoric disorder: Secondary | ICD-10-CM

## 2022-10-10 DIAGNOSIS — F411 Generalized anxiety disorder: Secondary | ICD-10-CM

## 2022-10-10 DIAGNOSIS — F331 Major depressive disorder, recurrent, moderate: Secondary | ICD-10-CM | POA: Diagnosis not present

## 2022-10-10 DIAGNOSIS — F4310 Post-traumatic stress disorder, unspecified: Secondary | ICD-10-CM | POA: Diagnosis not present

## 2022-10-10 DIAGNOSIS — R5382 Chronic fatigue, unspecified: Secondary | ICD-10-CM

## 2022-10-10 DIAGNOSIS — F5105 Insomnia due to other mental disorder: Secondary | ICD-10-CM

## 2022-10-10 MED ORDER — ESCITALOPRAM OXALATE 10 MG PO TABS: 15.0000 mg | ORAL_TABLET | Freq: Every day | ORAL | 1 refills | Status: DC
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
## 2022-10-25 NOTE — Telephone Encounter (Signed)
Created in error

## 2022-11-13 ENCOUNTER — Ambulatory Visit: Payer: No Typology Code available for payment source | Attending: Obstetrics and Gynecology

## 2022-11-13 DIAGNOSIS — R293 Abnormal posture: Secondary | ICD-10-CM | POA: Insufficient documentation

## 2022-11-13 DIAGNOSIS — M62838 Other muscle spasm: Secondary | ICD-10-CM | POA: Diagnosis present

## 2022-11-13 DIAGNOSIS — M6281 Muscle weakness (generalized): Secondary | ICD-10-CM | POA: Insufficient documentation

## 2022-11-13 DIAGNOSIS — R279 Unspecified lack of coordination: Secondary | ICD-10-CM | POA: Diagnosis present
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
## 2022-11-19 ENCOUNTER — Ambulatory Visit: Payer: No Typology Code available for payment source

## 2022-11-19 DIAGNOSIS — M6281 Muscle weakness (generalized): Secondary | ICD-10-CM

## 2022-11-19 DIAGNOSIS — M62838 Other muscle spasm: Secondary | ICD-10-CM

## 2022-11-19 DIAGNOSIS — R279 Unspecified lack of coordination: Secondary | ICD-10-CM

## 2022-11-19 DIAGNOSIS — R293 Abnormal posture: Secondary | ICD-10-CM
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
## 2023-01-03 ENCOUNTER — Encounter (HOSPITAL_BASED_OUTPATIENT_CLINIC_OR_DEPARTMENT_OTHER): Payer: Self-pay

## 2023-01-03 ENCOUNTER — Other Ambulatory Visit: Payer: Self-pay

## 2023-01-03 ENCOUNTER — Emergency Department (HOSPITAL_BASED_OUTPATIENT_CLINIC_OR_DEPARTMENT_OTHER): Payer: No Typology Code available for payment source | Admitting: Radiology

## 2023-01-03 ENCOUNTER — Emergency Department (HOSPITAL_BASED_OUTPATIENT_CLINIC_OR_DEPARTMENT_OTHER)
Admission: EM | Admit: 2023-01-03 | Discharge: 2023-01-03 | Disposition: A | Discharge status: Home / Self Care | Payer: No Typology Code available for payment source | Attending: Emergency Medicine | Admitting: Emergency Medicine

## 2023-01-03 DIAGNOSIS — J45909 Unspecified asthma, uncomplicated: Secondary | ICD-10-CM | POA: Diagnosis not present

## 2023-01-03 DIAGNOSIS — Z91040 Latex allergy status: Secondary | ICD-10-CM | POA: Insufficient documentation

## 2023-01-03 DIAGNOSIS — R509 Fever, unspecified: Secondary | ICD-10-CM | POA: Diagnosis present

## 2023-01-03 DIAGNOSIS — J189 Pneumonia, unspecified organism: Secondary | ICD-10-CM

## 2023-01-03 DIAGNOSIS — Z1152 Encounter for screening for COVID-19: Secondary | ICD-10-CM | POA: Insufficient documentation

## 2023-01-03 DIAGNOSIS — J181 Lobar pneumonia, unspecified organism: Secondary | ICD-10-CM | POA: Diagnosis not present

## 2023-01-03 LAB — GROUP A STREP BY PCR: Group A Strep by PCR: NOT DETECTED

## 2023-01-03 LAB — RESP PANEL BY RT-PCR (RSV, FLU A&B, COVID)  RVPGX2
Influenza A by PCR: NEGATIVE
Influenza B by PCR: NEGATIVE
Resp Syncytial Virus by PCR: NEGATIVE
SARS Coronavirus 2 by RT PCR: NEGATIVE

## 2023-01-03 MED ORDER — DOXYCYCLINE HYCLATE 100 MG PO CAPS: 100.0000 mg | ORAL_CAPSULE | Freq: Two times a day (BID) | ORAL | 0 refills | Status: AC

## 2023-01-03 MED ORDER — AMOXICILLIN-POT CLAVULANATE 875-125 MG PO TABS: 1.0000 | ORAL_TABLET | Freq: Two times a day (BID) | ORAL | 0 refills | Status: AC

## 2023-01-03 NOTE — ED Triage Notes (Signed)
Patient here POV from Home.  Endorses Sore Throat that began at 0500 today. Followed by CP and Fever of 101.3   No Cough. No Congestion.  NAD noted during Triage. A&Ox4. GCS 15. Ambulatory.

## 2023-01-03 NOTE — ED Provider Notes (Signed)
[place] EMERGENCY DEPARTMENT AT [facility] Provider Note   CSN: [id number] Arrival date & time: 01/03/23  2022     History {Add pertinent medical, surgical, social history, OB history to HPI:1} Chief Complaint  Patient presents with   Fever    [name] is a 42 y.o. female.  42 year old female with a history of asthma, migraines, and anxiety who presents emergency department sore throat.  Says that she started feeling a sore throat today.  No voice changes or shortness of breath.  Has had a mild headache as well and a fever of 101.3.  No significant cough.  Says that she has some chest discomfort when breathing in her upper chest and lower throat.  No shortness of breath.  No known sick contacts.       Home Medications Prior to Admission medications   Medication Sig Start Date End Date Taking? Authorizing Provider  ALPRAZolam Prudy Feeler) 0.5 MG tablet 1 daily as needed for panic 06/05/22   Cottle, Steva Ready., MD  amLODipine (NORVASC) 5 MG tablet Take 1 tablet (5 mg total) by mouth daily. 03/01/22   Meriam Sprague, MD  cyclobenzaprine (FLEXERIL) 5 MG tablet Take 5-10 mg by mouth daily as needed (menstrual cramps).     [provider]  dibucaine (NUPERCAINAL) 1 % OINT Place 1 application. rectally as needed for hemorrhoids. 01/09/22   Essie Hart, MD  escitalopram (LEXAPRO) 10 MG tablet Take 1.5 tablets (15 mg total) by mouth daily. 10/10/22   Cottle, Steva Ready., MD  Prenatal Vit-Fe Fumarate-FA (MULTIVITAMIN-PRENATAL) 27-0.8 MG TABS tablet Take 1 tablet by mouth daily at 12 noon.    [provider]  PROAIR HFA 108 (90 BASE) MCG/ACT inhaler Inhale 2 puffs into the lungs every 4 (four) hours as needed for wheezing.  11/22/14   [provider]      Allergies    Effexor [venlafaxine hcl], Elavil [amitriptyline], Erythromycin, Iohexol, Latex, and Sulfa antibiotics    Review of Systems   Review of Systems  Physical Exam Updated Vital  Signs BP (!) 137/100 (BP Location: Right Arm)   Pulse (!) 102   Temp 98.9 F (37.2 C) (Oral)   Resp 18   Ht 5\' 7"  (1.702 m)   Wt 117.9 kg   SpO2 98%   BMI 40.72 kg/m  Physical Exam Vitals and nursing note reviewed.  Constitutional:      General: She is not in acute distress.    Appearance: She is well-developed.     Comments: Tolerating her secretions.  No voice changes.  No respiratory distress.  HENT:     Head: Normocephalic and atraumatic.     Right Ear: External ear normal.     Left Ear: External ear normal.     Nose: Nose normal.     Mouth/Throat:     Mouth: Mucous membranes are moist.     Pharynx: Oropharynx is clear. No oropharyngeal exudate or posterior oropharyngeal erythema.     Comments: Uvula midline Eyes:     Extraocular Movements: Extraocular movements intact.     Conjunctiva/sclera: Conjunctivae normal.     Pupils: Pupils are equal, round, and reactive to light.  Cardiovascular:     Rate and Rhythm: Normal rate and regular rhythm.     Heart sounds: No murmur heard. Pulmonary:     Effort: Pulmonary effort is normal. No respiratory distress.     Breath sounds: Normal breath sounds. No stridor.  Abdominal:  General: Abdomen is flat.     Palpations: Abdomen is soft.  Musculoskeletal:     Cervical back: Normal range of motion and neck supple.  Skin:    General: Skin is warm and dry.  Neurological:     Mental Status: She is alert and oriented to person, place, and time. Mental status is at baseline.  Psychiatric:        Mood and Affect: Mood normal.     ED Results / Procedures / Treatments   Labs (all labs ordered are listed, but only abnormal results are displayed) Labs Reviewed  GROUP A STREP BY PCR  RESP PANEL BY RT-PCR (RSV, FLU A&B, COVID)  [id number]    EKG EKG Interpretation  Date/Time:  Friday Jan 03 2023 20:28:40 EDT Ventricular Rate:  98 PR Interval:  144 QRS Duration: 84 QT Interval:  320 QTC Calculation: 408 R Axis:   64 Text  Interpretation: Normal sinus rhythm Normal ECG When compared with ECG of 07-Jan-2022 13:24, No significant change was found Confirmed by Vonita Moss [phone]) on 01/03/2023 10:34:12 PM  Radiology No results found.  Procedures Procedures  {Document cardiac monitor, telemetry assessment procedure when appropriate:1}  Medications Ordered in ED Medications - No data to display  ED Course/ Medical Decision Making/ A&P   {   Click here for ABCD2, HEART and other calculatorsREFRESH Note before signing :1}                          Medical Decision Making Amount and/or Complexity of Data Reviewed Radiology: ordered. ECG/medicine tests: ordered.  Risk Prescription drug management.   ***  {Document critical care time when appropriate:1} {Document review of labs and clinical decision tools ie heart score, Chads2Vasc2 etc:1}  {Document your independent review of radiology images, and any outside records:1} {Document your discussion with family members, caretakers, and with consultants:1} {Document social determinants of health affecting pt's care:1} {Document your decision making why or why not admission, treatments were needed:1} Final Clinical Impression(s) / ED Diagnoses Final diagnoses:  None    Rx / DC Orders ED Discharge Orders     None

## 2023-01-03 NOTE — Discharge Instructions (Signed)
You were seen for your fever and chest discomfort in the emergency department.  You were found to have a pneumonia.  At home, please take the antibiotics we have prescribed you.    Check your MyChart online for the results of any tests that had not resulted by the time you left the emergency department.   Follow-up with your primary doctor in 2-3 days regarding your visit.    Return immediately to the emergency department if you experience any of the following: Difficulty breathing, or any other concerning symptoms.    Thank you for visiting our Emergency Department. It was a pleasure taking care of you today.

## 2023-01-21 ENCOUNTER — Other Ambulatory Visit: Payer: Self-pay | Admitting: Family Medicine

## 2023-01-21 ENCOUNTER — Ambulatory Visit
Admission: RE | Admit: 2023-01-21 | Discharge: 2023-01-21 | Disposition: A | Discharge status: Home / Self Care | Payer: No Typology Code available for payment source | Source: Ambulatory Visit | Attending: Family Medicine | Admitting: Family Medicine

## 2023-01-21 DIAGNOSIS — R059 Cough, unspecified: Secondary | ICD-10-CM

## 2023-02-12 ENCOUNTER — Other Ambulatory Visit: Payer: Self-pay

## 2023-02-12 ENCOUNTER — Other Ambulatory Visit: Payer: Self-pay | Admitting: Psychiatry

## 2023-02-12 DIAGNOSIS — F5105 Insomnia due to other mental disorder: Secondary | ICD-10-CM

## 2023-02-12 DIAGNOSIS — F411 Generalized anxiety disorder: Secondary | ICD-10-CM

## 2023-02-12 DIAGNOSIS — F4310 Post-traumatic stress disorder, unspecified: Secondary | ICD-10-CM

## 2023-02-12 MED ORDER — AMLODIPINE BESYLATE 5 MG PO TABS: 5.0000 mg | ORAL_TABLET | Freq: Every day | ORAL | 0 refills | Status: DC

## 2023-04-03 ENCOUNTER — Encounter: Payer: Self-pay | Admitting: Psychiatry

## 2023-04-03 ENCOUNTER — Ambulatory Visit: Payer: No Typology Code available for payment source | Admitting: Psychiatry

## 2023-04-03 DIAGNOSIS — F5105 Insomnia due to other mental disorder: Secondary | ICD-10-CM | POA: Diagnosis not present

## 2023-04-03 DIAGNOSIS — F4310 Post-traumatic stress disorder, unspecified: Secondary | ICD-10-CM

## 2023-04-03 DIAGNOSIS — F411 Generalized anxiety disorder: Secondary | ICD-10-CM

## 2023-04-03 DIAGNOSIS — R5382 Chronic fatigue, unspecified: Secondary | ICD-10-CM

## 2023-04-03 DIAGNOSIS — F331 Major depressive disorder, recurrent, moderate: Secondary | ICD-10-CM

## 2023-04-03 DIAGNOSIS — F3281 Premenstrual dysphoric disorder: Secondary | ICD-10-CM
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
## 2023-05-06 ENCOUNTER — Telehealth: Payer: Self-pay | Admitting: Psychiatry

## 2023-05-06 NOTE — Telephone Encounter (Signed)
LVM to RC 

## 2023-05-06 NOTE — Telephone Encounter (Signed)
Patient lvm at 11:19 stating that her mood has been up and down. She is dealing with grief from a close relative recently passing away as well as a friend's daughter. At last appt it was discussed starting back on her birth control and she has done that. She inquired if she should go up in dosage on her Lexapro and if they would help. She would like some advice regarding this. Ph: [phone]

## 2023-05-12 NOTE — Telephone Encounter (Signed)
Pt called back at 1:07p

## 2023-05-12 NOTE — Telephone Encounter (Signed)
Left second VM to RC.  

## 2023-05-13 ENCOUNTER — Other Ambulatory Visit: Payer: Self-pay

## 2023-05-13 NOTE — Telephone Encounter (Signed)
Patient reported she currently has Rx for 15 mg and would like to keep it. Note says that she previously tried 20 and she was too fatigued. She does not need a RF at this time.

## 2023-05-13 NOTE — Telephone Encounter (Addendum)
Patient reported being depressed due to some recent deaths. She was asking if increasing Lexapro would be helpful. She said she was recently started on Qsymia by her welness doctor and is reporting she does feel better this week than she did last week.   Currently on 15 mg Lexapro and alprazolam 0.5 mg every day prn panic.

## 2023-05-13 NOTE — Telephone Encounter (Signed)
Yes it would be fine to increase Lexapro to 20 mg HS.  We disc that option at her last visit.  If she needs it send RX Lexapro 20 mg tab, 1 HS, #90, no RF

## 2023-05-14 ENCOUNTER — Other Ambulatory Visit: Payer: Self-pay

## 2023-05-14 MED ORDER — AMLODIPINE BESYLATE 5 MG PO TABS: 5.0000 mg | ORAL_TABLET | Freq: Every day | ORAL | 0 refills | Status: AC

## 2023-05-14 NOTE — Telephone Encounter (Signed)
If she wants more help with depression from meds and doesn't want to increase lexapro, the easiest alternative would be to switch from Lexapro to duloxetine (Cymbalta) .  It is a fairly easy switch with less risk of fatigue and is a good antidepressant.

## 2023-05-15 NOTE — Telephone Encounter (Signed)
LVM TO RTC 

## 2023-05-16 NOTE — Telephone Encounter (Signed)
Sent a MyChart message with recommendations.

## 2023-08-13 ENCOUNTER — Other Ambulatory Visit: Payer: Self-pay | Admitting: Psychiatry

## 2023-08-13 DIAGNOSIS — F331 Major depressive disorder, recurrent, moderate: Secondary | ICD-10-CM

## 2023-08-13 DIAGNOSIS — F3281 Premenstrual dysphoric disorder: Secondary | ICD-10-CM

## 2023-08-13 DIAGNOSIS — F4310 Post-traumatic stress disorder, unspecified: Secondary | ICD-10-CM

## 2023-08-30 ENCOUNTER — Other Ambulatory Visit: Payer: Self-pay | Admitting: Psychiatry

## 2023-08-30 DIAGNOSIS — F331 Major depressive disorder, recurrent, moderate: Secondary | ICD-10-CM

## 2023-08-30 DIAGNOSIS — F4310 Post-traumatic stress disorder, unspecified: Secondary | ICD-10-CM

## 2023-08-30 DIAGNOSIS — F3281 Premenstrual dysphoric disorder: Secondary | ICD-10-CM

## 2023-10-02 ENCOUNTER — Ambulatory Visit (INDEPENDENT_AMBULATORY_CARE_PROVIDER_SITE_OTHER): Payer: No Typology Code available for payment source | Admitting: Psychiatry

## 2023-10-02 ENCOUNTER — Encounter: Payer: Self-pay | Admitting: Psychiatry

## 2023-10-02 DIAGNOSIS — R5382 Chronic fatigue, unspecified: Secondary | ICD-10-CM

## 2023-10-02 DIAGNOSIS — F3281 Premenstrual dysphoric disorder: Secondary | ICD-10-CM

## 2023-10-02 DIAGNOSIS — F5105 Insomnia due to other mental disorder: Secondary | ICD-10-CM | POA: Diagnosis not present

## 2023-10-02 DIAGNOSIS — F411 Generalized anxiety disorder: Secondary | ICD-10-CM | POA: Diagnosis not present

## 2023-10-02 DIAGNOSIS — F331 Major depressive disorder, recurrent, moderate: Secondary | ICD-10-CM | POA: Diagnosis not present

## 2023-10-02 DIAGNOSIS — F4310 Post-traumatic stress disorder, unspecified: Secondary | ICD-10-CM | POA: Diagnosis not present

## 2023-10-02 MED ORDER — ESCITALOPRAM OXALATE 10 MG PO TABS: 15.0000 mg | ORAL_TABLET | Freq: Every day | ORAL | 1 refills | Status: DC

## 2023-10-02 MED ORDER — ALPRAZOLAM 0.5 MG PO TABS
ORAL_TABLET | ORAL | 1 refills | Status: DC
Start: 2023-10-02 — End: 2024-05-05

## 2023-10-02 MED ORDER — TRAZODONE HCL 50 MG PO TABS: ORAL_TABLET | ORAL | 1 refills | Status: DC
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
## 2023-10-02 NOTE — Patient Instructions (Signed)
 trazodone 50-100 mg HS and if fails then go back to 0.5 mg alprazolam

## 2023-10-24 ENCOUNTER — Other Ambulatory Visit: Payer: Self-pay | Admitting: Psychiatry

## 2023-10-24 DIAGNOSIS — F5105 Insomnia due to other mental disorder: Secondary | ICD-10-CM

## 2024-03-31 ENCOUNTER — Ambulatory Visit: Payer: No Typology Code available for payment source | Admitting: Psychiatry

## 2024-05-05 ENCOUNTER — Encounter: Payer: Self-pay | Admitting: Psychiatry

## 2024-05-05 ENCOUNTER — Ambulatory Visit (INDEPENDENT_AMBULATORY_CARE_PROVIDER_SITE_OTHER): Admitting: Psychiatry

## 2024-05-05 DIAGNOSIS — F411 Generalized anxiety disorder: Secondary | ICD-10-CM | POA: Diagnosis not present

## 2024-05-05 DIAGNOSIS — F5105 Insomnia due to other mental disorder: Secondary | ICD-10-CM

## 2024-05-05 DIAGNOSIS — F4310 Post-traumatic stress disorder, unspecified: Secondary | ICD-10-CM

## 2024-05-05 DIAGNOSIS — R5382 Chronic fatigue, unspecified: Secondary | ICD-10-CM

## 2024-05-05 DIAGNOSIS — F331 Major depressive disorder, recurrent, moderate: Secondary | ICD-10-CM

## 2024-05-05 DIAGNOSIS — F3281 Premenstrual dysphoric disorder: Secondary | ICD-10-CM

## 2024-05-05 MED ORDER — ESCITALOPRAM OXALATE 10 MG PO TABS: 15.0000 mg | ORAL_TABLET | Freq: Every day | ORAL | 3 refills | Status: AC

## 2024-05-05 MED ORDER — ALPRAZOLAM 0.5 MG PO TABS: ORAL_TABLET | ORAL | 3 refills | Status: AC
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
## 2025-05-05 ENCOUNTER — Ambulatory Visit: Admitting: Psychiatry
